# Patient Record
Sex: Female | Born: 1985 | Race: Asian | Hispanic: No | Smoking: Former smoker
Health system: Southern US, Community
[De-identification: ages and names within clinical notes are randomized; demographics above are authoritative.]

## PROBLEM LIST (undated history)

## (undated) DIAGNOSIS — E282 Polycystic ovarian syndrome: Secondary | ICD-10-CM

## (undated) DIAGNOSIS — I10 Essential (primary) hypertension: Secondary | ICD-10-CM

## (undated) DIAGNOSIS — T7840XA Allergy, unspecified, initial encounter: Secondary | ICD-10-CM

## (undated) DIAGNOSIS — I471 Supraventricular tachycardia, unspecified: Secondary | ICD-10-CM

## (undated) HISTORY — DX: Supraventricular tachycardia: I47.1

## (undated) HISTORY — DX: Supraventricular tachycardia, unspecified: I47.10

## (undated) HISTORY — DX: Allergy, unspecified, initial encounter: T78.40XA

## (undated) HISTORY — DX: Polycystic ovarian syndrome: E28.2

---

## 2009-11-04 HISTORY — PX: CARDIAC ELECTROPHYSIOLOGY MAPPING AND ABLATION: SHX1292

## 2014-03-18 LAB — HM PAP SMEAR: HM PAP: NORMAL

## 2015-08-24 ENCOUNTER — Ambulatory Visit: Payer: Self-pay

## 2015-08-24 DIAGNOSIS — I10 Essential (primary) hypertension: Secondary | ICD-10-CM | POA: Insufficient documentation

## 2015-08-24 DIAGNOSIS — F419 Anxiety disorder, unspecified: Secondary | ICD-10-CM | POA: Insufficient documentation

## 2015-08-24 DIAGNOSIS — Z9889 Other specified postprocedural states: Secondary | ICD-10-CM | POA: Insufficient documentation

## 2015-08-31 ENCOUNTER — Institutional Professional Consult (permissible substitution): Payer: Self-pay | Admitting: Licensed Clinical Social Worker

## 2016-02-23 DIAGNOSIS — Z9889 Other specified postprocedural states: Secondary | ICD-10-CM

## 2016-02-23 DIAGNOSIS — F419 Anxiety disorder, unspecified: Secondary | ICD-10-CM

## 2016-02-23 DIAGNOSIS — I10 Essential (primary) hypertension: Secondary | ICD-10-CM

## 2016-03-15 ENCOUNTER — Encounter: Payer: Self-pay | Admitting: Emergency Medicine

## 2016-03-15 ENCOUNTER — Emergency Department
Admission: EM | Admit: 2016-03-15 | Discharge: 2016-03-15 | Disposition: A | Discharge status: Home / Self Care | Payer: 59 | Attending: Emergency Medicine | Admitting: Emergency Medicine

## 2016-03-15 DIAGNOSIS — Y999 Unspecified external cause status: Secondary | ICD-10-CM | POA: Diagnosis not present

## 2016-03-15 DIAGNOSIS — T23231A Burn of second degree of multiple right fingers (nail), not including thumb, initial encounter: Secondary | ICD-10-CM | POA: Insufficient documentation

## 2016-03-15 DIAGNOSIS — X19XXXA Contact with other heat and hot substances, initial encounter: Secondary | ICD-10-CM | POA: Insufficient documentation

## 2016-03-15 DIAGNOSIS — T23221A Burn of second degree of single right finger (nail) except thumb, initial encounter: Secondary | ICD-10-CM

## 2016-03-15 DIAGNOSIS — Y939 Activity, unspecified: Secondary | ICD-10-CM | POA: Insufficient documentation

## 2016-03-15 DIAGNOSIS — Y929 Unspecified place or not applicable: Secondary | ICD-10-CM | POA: Diagnosis not present

## 2016-03-15 DIAGNOSIS — I10 Essential (primary) hypertension: Secondary | ICD-10-CM | POA: Insufficient documentation

## 2016-03-15 DIAGNOSIS — F17210 Nicotine dependence, cigarettes, uncomplicated: Secondary | ICD-10-CM | POA: Insufficient documentation

## 2016-03-15 NOTE — ED Provider Notes (Signed)
[facility] Emergency Department Provider Note  ____________________________________________  Time seen: 3:20 AM  I have reviewed the triage vital signs and the nursing notes.   HISTORY  Chief Complaint Burn    HPI Brittany Walter is a 30 y.o. female who reports spilling boiling water on her right second third and fourth fingers at about 9:30 PM tonight. She immediately rinsed it in cold water but because of the pain she then stopped. Denies any other injuries. Last tetanus shot was in within the last 5 years.     Past Medical History  Diagnosis Date  . Allergy     Seasonal  . Supraventricular tachycardia [facility])      Patient Active Problem List   Diagnosis Date Noted  . Hypertension 08/24/2015  . Acute anxiety 08/24/2015  . History of cardiac radiofrequency ablation 08/24/2015     Past Surgical History  Procedure Laterality Date  . Cardiac electrophysiology mapping and ablation  2011     No current outpatient prescriptions on file.   Allergies Review of patient's allergies indicates no known allergies.   Family History  Problem Relation Age of Onset  . Adopted: Yes  . Family history unknown: Yes    Social History Social History  Substance Use Topics  . Smoking status: Current Every Day Smoker    Types: Cigarettes  . Smokeless tobacco: None  . Alcohol Use: No    Review of Systems  Constitutional:   No fever or chills.  Eyes:   No vision changes. No eye pain ENT:   No sore throat. No rhinorrhea. Musculoskeletal:   Pain with movement of the right hand fingers 10-point ROS otherwise negative.  ____________________________________________   PHYSICAL EXAM:  VITAL SIGNS: ED Triage Vitals  Enc Vitals Group     BP 03/15/16 0259 148/95 mmHg     Pulse Rate 03/15/16 0259 91     Resp 03/15/16 0259 20     Temp 03/15/16 0259 98 F (36.7 C)     Temp Source 03/15/16 0259 Oral     SpO2 03/15/16 0259 99 %     Weight 03/15/16  0259 147 lb (66.679 kg)     Height 03/15/16 0259  (1.499 m)     Head Cir --      Peak Flow --      Pain Score 03/15/16 0259 8     Pain Loc --      Pain Edu? --      Excl. in GC? --     Vital signs reviewed, nursing assessments reviewed.   Constitutional:   Alert and oriented. Well appearing and in no distress. Eyes:   No scleral icterus. No conjunctival pallor. PERRL. EOMI.  No nystagmus. ENT   Head:   Normocephalic and atraumatic.No splash injuries Musculoskeletal:   Tenderness in the right hand on the second third and fourth fingers. Soft tissues are soft. Intact distal perfusion with brisk cap refill in the fingertips. Over the right fourth finger on the dorsal DIP surface, there is a small approximately 2 millimeter blistering. Intact flexion and extension. Neurologic:   Normal speech and language.  CN 2-10 normal. Motor grossly intact. No gross focal neurologic deficits are appreciated.  Skin:    Skin is warm, dry and intact. Small blister as noted above. Erythema of the palmar hand and fingers.. No rash noted.  No petechiae, purpura, or bullae.  ____________________________________________    LABS (pertinent positives/negatives) (all labs ordered are listed, but only abnormal  results are displayed) Labs Reviewed - No data to display ____________________________________________   EKG    ____________________________________________    RADIOLOGY    ____________________________________________   PROCEDURES   ____________________________________________   INITIAL IMPRESSION / ASSESSMENT AND PLAN / ED COURSE  Pertinent labs & imaging results that were available during my care of the patient were reviewed by me and considered in my medical decision making (see chart for details).  Patient well appearing no acute distress. Presents with partial-thickness burn to the right hand fingers due to boiling water. She rinsed immediately, there is no evidence  of deep injury at this time. No evidence of compartment syndrome of the fingers or impaired perfusion. His well-appearing no acute distress, counseled for NSAIDs and follow up with primary care.     ____________________________________________   FINAL CLINICAL IMPRESSION(S) / ED DIAGNOSES  Final diagnoses:  Second degree burn of fingers, right, initial encounter       Portions of this note were generated with dragon dictation software. Dictation errors may occur despite best attempts at proofreading.   Sharman CheekPhillip Jeevan Kalla, MD 03/15/16 [phone]

## 2016-03-15 NOTE — ED Notes (Signed)
Patient ambulatory to triage with steady gait, without difficulty or distress noted; pt reports burn to right finger with boiling water while cooking few hrs PTA

## 2016-03-15 NOTE — Discharge Instructions (Signed)
Burn Care °Your skin is a natural barrier to infection. It is the largest organ of your body. Burns damage this natural protection. To help prevent infection, it is very important to follow your caregiver's instructions in the care of your burn. °Burns are classified as: °· First degree. There is only redness of the skin (erythema). No scarring is expected. °· Second degree. There is blistering of the skin. Scarring may occur with deeper burns. °· Third degree. All layers of the skin are injured, and scarring is expected. °HOME CARE INSTRUCTIONS  °· Wash your hands well before changing your bandage. °· Change your bandage as often as directed by your caregiver. °· Remove the old bandage. If the bandage sticks, you may soak it off with cool, clean water. °· Cleanse the burn thoroughly but gently with mild soap and water. °· Pat the area dry with a clean, dry cloth. °· Apply a thin layer of antibacterial cream to the burn. °· Apply a clean bandage as instructed by your caregiver. °· Keep the bandage as clean and dry as possible. °· Elevate the affected area for the first 24 hours, then as instructed by your caregiver. °· Only take over-the-counter or prescription medicines for pain, discomfort, or fever as directed by your caregiver. °SEEK IMMEDIATE MEDICAL CARE IF:  °· You develop excessive pain. °· You develop redness, tenderness, swelling, or red streaks near the burn. °· The burned area develops yellowish-white fluid (pus) or a bad smell. °· You have a fever. °MAKE SURE YOU:  °· Understand these instructions. °· Will watch your condition. °· Will get help right away if you are not doing well or get worse. °  °This information is not intended to replace advice given to you by your health care provider. Make sure you discuss any questions you have with your health care provider. °  °Document Released: 10/21/2005 Document Revised: 01/13/2012 Document Reviewed: 03/13/2011 °Elsevier Interactive Patient Education ©2016  [employer] ° °Second-Degree Burn °A second-degree burn affects the 2 outer layers of skin. The outer layer (epidermis) and the layer underneath it (dermis) are both burned. Another name for this type of burn is a partial thickness burn. A second-degree burn may be called minor or major. This depends on the size of the burn. It also depends on what parts of the skin are burned. Minor burns may be treated with first aid. Major burns are a medical emergency. °A second-degree burn is worse than a first-degree burn, but not as bad as a third-degree burn. A first-degree burn affects only the epidermis. A third-degree burn goes through all the layers of skin. A second-degree burn usually heals in 3 to 4 weeks. A minor second-degree burn usually does not leave a scar. Deeper second-degree burns may lead to scarring of the skin or contractures over joints. Contractures are scars that form over joints and may lead to reduced mobility at those joints. °CAUSES °· Heat (thermal) injury. This happens when skin comes in contact with something very hot. It could be a flame, a hot object, hot liquid, or steam. Most second-degree burns are thermal injuries. °· Radiation. Sunlight is one type of radiation that can burn the skin. Another type of radiation is used to heat food. Radiation is also used to treat some diseases, such as cancer. All types of radiation can burn the skin. Sunlight usually causes a first-degree burn. Radiation used for heating food or treating a disease can cause a second-degree burn. °· Electricity. Electrical burns can cause more   damage under the skin than on the surface. They should always be treated as major burns. °· Chemicals. Many chemicals can burn the skin. The burn should be flushed with cool water and checked by an emergency caregiver. °SYMPTOMS °Symptoms of second-degree burns include: °· Severe pain. °· Extreme tenderness. °· Deep redness. °· Blistered skin. °· Skin that has changed color. It  might look blotchy, wet, or shiny. °· Swelling. °TREATMENT °Some second-degree burns may need to be treated in a hospital. These include major burns, electrical burns, and chemical burns. Many other second-degree burns can be treated with regular first aid, such as: °· Cooling the burn. Use cool, germ-free (sterile) salt water. Place the burned area of skin into a tub of water, or cover the burned area with clean, wet towels. °· Taking pain medicine. °· Removing the dead skin from broken blisters. A trained caregiver may do this. Do not pop blisters. °· Gently washing your skin with mild soap. °· Covering the burned area with a cream. Silver sulfadiazine is a cream for burns. An antibiotic cream, such as bacitracin, may also be used to fight infection. Do not use other ointments or creams unless your caregiver says it is okay. °· Protecting the burn with a sterile, non-sticky bandage. °· Bandaging fingers and toes separately. This keeps them from sticking together. °· Taking an antibiotic. This can help prevent infection. °· Getting a tetanus shot. °HOME CARE INSTRUCTIONS °Medication °· Take any medicine prescribed by your caregiver. Follow the directions carefully. °· Ask your caregiver if you can take over-the-counter medicine to relieve pain and swelling. Do not give aspirin to children. °· Make sure your caregiver knows about all other medicines you take. This includes over-the-counter medicines. °Burn care °· You will need to change the bandage on your burn. You may need to do this 2 or 3 times each day. °¨ Gently clean the burned area. °¨ Put ointment on it. °¨ Cover the burn with a sterile bandage. °· For some deeper burns or burns that cover a large area, compression garments may be prescribed. These garments can help minimize scarring and protect your mobility. °· Do not put butter or oil on your skin. Use only the cream prescribed by your caregiver. °· Do not put ice on your burn. °· Do not break blisters  on your skin. °· Keep the bandaged area dry. You might need to take a sponge bath for awhile. Ask your caregiver when you can take a shower or a tub bath again. °· Do not scratch an itchy burn. Your caregiver may give you medicine to relieve very bad itching. °· Infection is a big danger after a second-degree burn. Tell your caregiver right away if you have signs of infection, such as: °¨ Redness or changing color in the burned area. °¨ Fluid leaking from the burn. °¨ Swelling in the burn area. °¨ A bad smell coming from the wound. °Follow-up °· Keep all follow-up appointments. This is important. This is how your caregiver can tell if your treatment is working. °· Protect your burn from sunlight. Use sunscreen whenever you go outside. Burned areas may be sensitive to the sun for up to 1 year. Exposure to the sun may also cause permanent darkening of scars. °SEEK MEDICAL CARE IF: °· You have any questions about medicines. °· You have any questions about your treatment. °· You wonder if it is okay to do a particular activity. °· You develop a fever of more than 100.5° F (38.1° C). °SEEK IMMEDIATE MEDICAL CARE IF: °·   You think your burn might be infected. It may change color, become red, leak fluid, swell, or smell bad. °· You develop a fever of more than 102° F (38.9° C). °  °This information is not intended to replace advice given to you by your health care provider. Make sure you discuss any questions you have with your health care provider. °  °Document Released: 03/25/2011 Document Revised: 01/13/2012 Document Reviewed: 03/25/2011 °Elsevier Interactive Patient Education ©2016 Elsevier Inc. ° °

## 2016-03-15 NOTE — ED Notes (Signed)
MD at bedside. 

## 2018-05-19 ENCOUNTER — Ambulatory Visit (INDEPENDENT_AMBULATORY_CARE_PROVIDER_SITE_OTHER): Payer: 59 | Admitting: Physician Assistant

## 2018-05-19 ENCOUNTER — Encounter: Payer: Self-pay | Admitting: Physician Assistant

## 2018-05-19 VITALS — BP 130/88 | HR 107 | Temp 98.5°F | Resp 16 | Wt 167.0 lb

## 2018-05-19 DIAGNOSIS — Z114 Encounter for screening for human immunodeficiency virus [HIV]: Secondary | ICD-10-CM

## 2018-05-19 DIAGNOSIS — Z8742 Personal history of other diseases of the female genital tract: Secondary | ICD-10-CM

## 2018-05-19 DIAGNOSIS — R030 Elevated blood-pressure reading, without diagnosis of hypertension: Secondary | ICD-10-CM | POA: Diagnosis not present

## 2018-05-19 DIAGNOSIS — Z0001 Encounter for general adult medical examination with abnormal findings: Secondary | ICD-10-CM

## 2018-05-19 DIAGNOSIS — Z0000 Encounter for general adult medical examination without abnormal findings: Secondary | ICD-10-CM

## 2018-05-19 DIAGNOSIS — E282 Polycystic ovarian syndrome: Secondary | ICD-10-CM | POA: Diagnosis not present

## 2018-05-19 DIAGNOSIS — Z130 Encounter for screening for diseases of the blood and blood-forming organs and certain disorders involving the immune mechanism: Secondary | ICD-10-CM

## 2018-05-19 DIAGNOSIS — Z131 Encounter for screening for diabetes mellitus: Secondary | ICD-10-CM

## 2018-05-19 DIAGNOSIS — Z13220 Encounter for screening for lipoid disorders: Secondary | ICD-10-CM

## 2018-05-19 DIAGNOSIS — Z124 Encounter for screening for malignant neoplasm of cervix: Secondary | ICD-10-CM

## 2018-05-19 DIAGNOSIS — Z1329 Encounter for screening for other suspected endocrine disorder: Secondary | ICD-10-CM

## 2018-05-19 MED ORDER — METFORMIN HCL ER 500 MG PO TB24: ORAL_TABLET | ORAL | 0 refills | Status: DC

## 2018-05-19 NOTE — Progress Notes (Signed)
[name], Female    DOB: Feb 16, 1986, 32 y.o.   MRN: [id number] Visit Date: 05/19/2018  Today's Provider: Trey Sailors, PA-C   Chief Complaint  Patient presents with  . Establish Care  . Annual Exam   Subjective:    Establish Care: Brittany Walter is a 32 y.o. female who presents today to establish care and for a complete physical maintenance. Patient sleeps well. Patient is exercising a little. Living with [facility] of two and half years. No children. Works at [employer].   Patient reports her las physical was 2014-2015. Reports that her pap at that time was normal. She reports that her previous PCP is in [place]. Practice name [facility].  History PCOS - previously diagnosed by endocrinologist and was given Metformin 800 mg, had hard time swallowing pills. Would like to restart this. -----------------------------------------------------------------   Review of Systems  Constitutional: Negative.   HENT: Negative.   Eyes: Positive for itching.  Respiratory: Negative.   Cardiovascular: Negative.   Gastrointestinal: Negative.   Endocrine: Negative.   Genitourinary: Negative.   Musculoskeletal: Positive for neck stiffness.       "Possible Ganglion"  Skin: Negative.   Allergic/Immunologic: Negative.   Neurological: Negative.   Hematological: Negative.   Psychiatric/Behavioral: Negative.     Social History      She  reports that she has been smoking cigarettes.  She does not have any smokeless tobacco history on file. She reports that she does not drink alcohol or use drugs.       Social History   Socioeconomic History  . Marital status: Single    Spouse name: Not on file  . Number of children: Not on file  . Years of education: Not on file  . Highest education level: Not on file  Occupational History  . Not on file  Social Needs  . Financial resource strain: Not on file  . Food insecurity:    Worry: Not on  file    Inability: Not on file  . Transportation needs:    Medical: Not on file    Non-medical: Not on file  Tobacco Use  . Smoking status: Current Every Day Smoker    Types: Cigarettes  Substance and Sexual Activity  . Alcohol use: No  . Drug use: No  . Sexual activity: Not on file  Lifestyle  . Physical activity:    Days per week: Not on file    Minutes per session: Not on file  . Stress: Not on file  Relationships  . Social connections:    Talks on phone: Not on file    Gets together: Not on file    Attends religious service: Not on file    Active member of club or organization: Not on file    Attends meetings of clubs or organizations: Not on file    Relationship status: Not on file  Other Topics Concern  . Not on file  Social History Narrative  . Not on file    Past Medical History:  Diagnosis Date  . Allergy    Seasonal  . PCOS (polycystic ovarian syndrome)   . Supraventricular tachycardia [facility])      Patient Active Problem List   Diagnosis Date Noted  . Hypertension 08/24/2015  . Acute anxiety 08/24/2015  . History of cardiac radiofrequency ablation 08/24/2015    Past Surgical History:  Procedure Laterality Date  . CARDIAC ELECTROPHYSIOLOGY MAPPING AND ABLATION  2011  Family History        No family status information on file.        Her She was adopted. Family history is unknown by patient.      No Known Allergies  No current outpatient medications on file.   Patient Care Team: Maryella ShiversPollak, [place] M, PA-C as PCP - General (Physician Assistant)      Objective:   Vitals: BP 130/88 (BP Location: Left Arm, Patient Position: Sitting, Cuff Size: Normal)   Pulse (!) 107   Temp 98.5 F (36.9 C) (Oral)   Resp 16   Wt 167 lb (75.8 kg)   SpO2 99%   BMI 33.73 kg/m    Vitals:   05/19/18 1503  BP: 130/88  Pulse: (!) 107  Resp: 16  Temp: 98.5 F (36.9 C)  TempSrc: Oral  SpO2: 99%  Weight: 167 lb (75.8 kg)     Physical Exam    Constitutional: She is oriented to person, place, and time. She appears well-developed and well-nourished.  HENT:  Head: Normocephalic.  Right Ear: External ear normal.  Left Ear: External ear normal.  Nose: Nose normal.  Mouth/Throat: Oropharynx is clear and moist.  Eyes: Pupils are equal, round, and reactive to light. Conjunctivae and EOM are normal.  Neck: Normal range of motion. Neck supple.  Cardiovascular: Normal rate, regular rhythm, normal heart sounds and intact distal pulses.  Pulmonary/Chest: Effort normal and breath sounds normal.  Abdominal: Soft. Bowel sounds are normal.  Genitourinary: Vagina normal and uterus normal. Cervix exhibits no motion tenderness and no friability.  Musculoskeletal: Normal range of motion.  Neurological: She is alert and oriented to person, place, and time.  Skin: Skin is warm.  Psychiatric: She has a normal mood and affect. Her behavior is normal. Judgment and thought content normal.     Depression Screen PHQ 2/9 Scores 05/19/2018  PHQ - 2 Score 0      Assessment & Plan:     Routine Health Maintenance and Physical Exam  Exercise Activities and Dietary recommendations Goals    None       There is no immunization history on file for this patient.  Health Maintenance  Topic Date Due  . HIV Screening  06/09/2003  . TETANUS/TDAP  06/09/2007  . PAP SMEAR  06/08/2009  . INFLUENZA VACCINE  06/04/2018     Discussed health benefits of physical activity, and encouraged her to engage in regular exercise appropriate for her age and condition.    1. Annual physical exam   2. PCOS (polycystic ovarian syndrome)  - metFORMIN (GLUCOPHAGE XR) 500 MG 24 hr tablet; 1 tab in the morning x 1wk. 1 tab in the morning & night x 1wk. 2 tabs in morning and 1 at night x 1 wk. 2 tabs in the morning and 2 night  Dispense: 180 tablet; Refill: 0  3. History of irregular menstrual cycles   4. Elevated BP without diagnosis of hypertension   5.  Cervical cancer screening  - Pap IG w/ reflex to HPV when ASC-U  6. Screening for thyroid disorder  - TSH  7. Lipid screening  - Lipid panel  8. Diabetes mellitus screening  - Comprehensive metabolic panel  9. Screening for deficiency anemia  - CBC with Differential/Platelet  10. Encounter for screening for HIV  - HIV antibody (with reflex)  Return in about 2 months (around 07/20/2018) for PCOS .  The entirety of the information documented in the History of Present Illness, Review of  Systems and Physical Exam were personally obtained by me. Portions of this information were initially documented by Hetty Ely, CMA and reviewed by me for thoroughness and accuracy.      --------------------------------------------------------------------    Trey Sailors, PA-C  [facility]

## 2018-05-20 LAB — COMPREHENSIVE METABOLIC PANEL
ALT: 39 IU/L — ABNORMAL HIGH (ref 0–32)
AST: 29 IU/L (ref 0–40)
Albumin/Globulin Ratio: 2 (ref 1.2–2.2)
Albumin: 5.3 g/dL (ref 3.5–5.5)
Alkaline Phosphatase: 53 IU/L (ref 39–117)
BUN/Creatinine Ratio: 21 (ref 9–23)
BUN: 13 mg/dL (ref 6–20)
Bilirubin Total: 0.3 mg/dL (ref 0.0–1.2)
CO2: 22 mmol/L (ref 20–29)
Calcium: 9.8 mg/dL (ref 8.7–10.2)
Chloride: 99 mmol/L (ref 96–106)
Creatinine, Ser: 0.63 mg/dL (ref 0.57–1.00)
GFR calc Af Amer: 140 mL/min/{1.73_m2} (ref >59)
GFR calc non Af Amer: 122 mL/min/{1.73_m2} (ref >59)
Globulin, Total: 2.7 g/dL (ref 1.5–4.5)
Glucose: 82 mg/dL (ref 65–99)
Potassium: 4 mmol/L (ref 3.5–5.2)
Sodium: 137 mmol/L (ref 134–144)
Total Protein: 8 g/dL (ref 6.0–8.5)

## 2018-05-20 LAB — CBC WITH DIFFERENTIAL/PLATELET
Basophils Absolute: 0.1 10*3/uL (ref 0.0–0.2)
Basos: 1 %
EOS (ABSOLUTE): 0.7 10*3/uL — ABNORMAL HIGH (ref 0.0–0.4)
Eos: 6 %
Hematocrit: 42.9 % (ref 34.0–46.6)
Hemoglobin: 15.5 g/dL (ref 11.1–15.9)
Immature Grans (Abs): 0 10*3/uL (ref 0.0–0.1)
Immature Granulocytes: 0 %
Lymphocytes Absolute: 3.9 10*3/uL — ABNORMAL HIGH (ref 0.7–3.1)
Lymphs: 35 %
MCH: 30.6 pg (ref 26.6–33.0)
MCHC: 36.1 g/dL — ABNORMAL HIGH (ref 31.5–35.7)
MCV: 85 fL (ref 79–97)
Monocytes Absolute: 0.7 10*3/uL (ref 0.1–0.9)
Monocytes: 6 %
Neutrophils Absolute: 5.8 10*3/uL (ref 1.4–7.0)
Neutrophils: 52 %
Platelets: 346 10*3/uL (ref 150–450)
RBC: 5.07 x10E6/uL (ref 3.77–5.28)
RDW: 13.7 % (ref 12.3–15.4)
WBC: 11.1 10*3/uL — ABNORMAL HIGH (ref 3.4–10.8)

## 2018-05-20 LAB — LIPID PANEL
Chol/HDL Ratio: 4.8 ratio — ABNORMAL HIGH (ref 0.0–4.4)
Cholesterol, Total: 200 mg/dL — ABNORMAL HIGH (ref 100–199)
HDL: 42 mg/dL (ref >39)
LDL Calculated: 118 mg/dL — ABNORMAL HIGH (ref 0–99)
Triglycerides: 200 mg/dL — ABNORMAL HIGH (ref 0–149)
VLDL Cholesterol Cal: 40 mg/dL (ref 5–40)

## 2018-05-20 LAB — TSH: TSH: 1.6 u[IU]/mL (ref 0.450–4.500)

## 2018-05-20 LAB — HIV ANTIBODY (ROUTINE TESTING W REFLEX): HIV Screen 4th Generation wRfx: NONREACTIVE

## 2018-05-21 LAB — PAP IG W/ RFLX HPV ASCU: PAP Smear Comment: 0

## 2018-05-22 ENCOUNTER — Telehealth: Payer: Self-pay

## 2018-05-22 NOTE — Telephone Encounter (Signed)
-----   Message from Trey SailorsAdriana M Pollak, [place]PA-C sent at 05/21/2018  4:44 PM EDT ----- Small white count, likely a virus not needing treatment. Normal CMET, no DM, normal kidney, one slightly elevated liver enzyme but just slightly. Cholesterol good. TSH normal, HIV negative, PAP is normal and should be repeated 3 years.

## 2018-05-22 NOTE — Telephone Encounter (Signed)
Pt advised.   Thanks,   -Tong Pieczynski  

## 2018-05-22 NOTE — Telephone Encounter (Signed)
LMTCB 05/22/2018  Thanks,   -Ayianna Darnold  

## 2018-06-03 ENCOUNTER — Encounter: Payer: Self-pay | Admitting: Physician Assistant

## 2018-08-11 ENCOUNTER — Ambulatory Visit: Payer: Self-pay | Admitting: Physician Assistant

## 2019-04-21 ENCOUNTER — Other Ambulatory Visit: Payer: Self-pay

## 2019-04-21 ENCOUNTER — Ambulatory Visit: Payer: 59 | Admitting: Physician Assistant

## 2019-04-21 ENCOUNTER — Encounter: Payer: Self-pay | Admitting: Physician Assistant

## 2019-04-21 VITALS — BP 150/90 | HR 99 | Temp 99.4°F | Resp 16 | Wt 171.2 lb

## 2019-04-21 DIAGNOSIS — I160 Hypertensive urgency: Secondary | ICD-10-CM | POA: Diagnosis not present

## 2019-04-21 MED ORDER — CLONIDINE HCL 0.1 MG PO TABS: 0.1000 mg | ORAL_TABLET | Freq: Every day | ORAL | 0 refills | Status: DC | PRN

## 2019-04-21 MED ORDER — CLONIDINE HCL 0.1 MG PO TABS
0.1000 mg | ORAL_TABLET | Freq: Every day | ORAL | 0 refills | Status: DC | PRN
Start: 2019-04-21 — End: 2019-06-17

## 2019-04-21 NOTE — Patient Instructions (Signed)
Preventing Hypertension Hypertension, commonly called high blood pressure, is when the force of blood pumping through the arteries is too strong. Arteries are blood vessels that carry blood from the heart throughout the body. Over time, hypertension can damage the arteries and decrease blood flow to important parts of the body, including the brain, heart, and kidneys. Often, hypertension does not cause symptoms until blood pressure is very high. For this reason, it is important to have your blood pressure checked on a regular basis. Hypertension can often be prevented with diet and lifestyle changes. If you already have hypertension, you can control it with diet and lifestyle changes, as well as medicine. What nutrition changes can be made? Maintain a healthy diet. This includes:  Eating less salt (sodium). Ask your health care provider how much sodium is safe for you to have. The general recommendation is to consume less than 1 tsp (2,300 mg) of sodium a day. ? Do not add salt to your food. ? Choose low-sodium options when grocery shopping and eating out.  Limiting fats in your diet. You can do this by eating low-fat or fat-free dairy products and by eating less red meat.  Eating more fruits, vegetables, and whole grains. Make a goal to eat: ? 1-2 cups of fresh fruits and vegetables each day. ? 3-4 servings of whole grains each day.  Avoiding foods and beverages that have added sugars.  Eating fish that contain healthy fats (omega-3 fatty acids), such as mackerel or salmon. If you need help putting together a healthy eating plan, try the DASH diet. This diet is high in fruits, vegetables, and whole grains. It is low in sodium, red meat, and added sugars. DASH stands for Dietary Approaches to Stop Hypertension. What lifestyle changes can be made?   Lose weight if you are overweight. Losing just 3?5% of your body weight can help prevent or control hypertension. ? For example, if your present  weight is 200 lb (91 kg), a loss of 3-5% of your weight means losing 6-10 lb (2.7-4.5 kg). ? Ask your health care provider to help you with a diet and exercise plan to safely lose weight.  Get enough exercise. Do at least 150 minutes of moderate-intensity exercise each week. ? You could do this in short exercise sessions several times a day, or you could do longer exercise sessions a few times a week. For example, you could take a brisk 10-minute walk or bike ride, 3 times a day, for 5 days a week.  Find ways to reduce stress, such as exercising, meditating, listening to music, or taking a yoga class. If you need help reducing stress, ask your health care provider.  Do not smoke. This includes e-cigarettes. Chemicals in tobacco and nicotine products raise your blood pressure each time you smoke. If you need help quitting, ask your health care provider.  Avoid alcohol. If you drink alcohol, limit alcohol intake to no more than 1 drink a day for nonpregnant women and 2 drinks a day for men. One drink equals 12 oz of beer, 5 oz of wine, or 1 oz of hard liquor. Why are these changes important? Diet and lifestyle changes can help you prevent hypertension, and they may make you feel better overall and improve your quality of life. If you have hypertension, making these changes will help you control it and help prevent major complications, such as:  Hardening and narrowing of arteries that supply blood to: ? Your heart. This can cause a heart  attack. ? Your brain. This can cause a stroke. ? Your kidneys. This can cause kidney failure.  Stress on your heart muscle, which can cause heart failure. What can I do to lower my risk?  Work with your health care provider to make a hypertension prevention plan that works for you. Follow your plan and keep all follow-up visits as told by your health care provider.  Learn how to check your blood pressure at home. Make sure that you know your personal target  blood pressure, as told by your health care provider. How is this treated? In addition to diet and lifestyle changes, your health care provider may recommend medicines to help lower your blood pressure. You may need to try a few different medicines to find what works best for you. You also may need to take more than one medicine. Take over-the-counter and prescription medicines only as told by your health care provider. Where to find support Your health care provider can help you prevent hypertension and help you keep your blood pressure at a healthy level. Your local hospital or your community may also provide support services and prevention programs. The American Heart Association offers an online support network at: https://www.lee.net/http://supportnetwork.heart.org/high-blood-pressure Where to find more information Learn more about hypertension from:  National Heart, Lung, and Blood Institute: https://www.peterson.org/www.nhlbi.nih.gov/health/health-topics/topics/hbp  Centers for Disease Control and Prevention: AboutHD.co.nzwww.cdc.gov/bloodpressure  American Academy of Family Physicians: http://familydoctor.org/familydoctor/en/diseases-conditions/high-blood-pressure.printerview.all.html Learn more about the DASH diet from:  National Heart, Lung, and Blood Institute: WedMap.itwww.nhlbi.nih.gov/health/health-topics/topics/dash Contact a health care provider if:  You think you are having a reaction to medicines you have taken.  You have recurrent headaches or feel dizzy.  You have swelling in your ankles.  You have trouble with your vision. Summary  Hypertension often does not cause any symptoms until blood pressure is very high. It is important to get your blood pressure checked regularly.  Diet and lifestyle changes are the most important steps in preventing hypertension.  By keeping your blood pressure in a healthy range, you can prevent complications like heart attack, heart failure, stroke, and kidney failure.  Work with your health care  provider to make a hypertension prevention plan that works for you. This information is not intended to replace advice given to you by your health care provider. Make sure you discuss any questions you have with your health care provider. Document Released: 11/05/2015 Document Revised: 07/01/2016 Document Reviewed: 07/01/2016 Elsevier Interactive Patient Education  2019 Elsevier Inc.   Clonidine tablets What is this medicine? CLONIDINE (KLOE ni deen) is used to treat high blood pressure. This medicine may be used for other purposes; ask your health care provider or pharmacist if you have questions. COMMON BRAND NAME(S): Catapres What should I tell my health care provider before I take this medicine? They need to know if you have any of these conditions: -kidney disease -an unusual or allergic reaction to clonidine, other medicines, foods, dyes, or preservatives -pregnant or trying to get pregnant -breast-feeding How should I use this medicine? Take this medicine by mouth with a glass of water. Follow the directions on the prescription label. Take your doses at regular intervals. Do not take your medicine more often than directed. Do not suddenly stop taking this medicine. You must gradually reduce the dose or you may get a dangerous increase in blood pressure. Ask your doctor or health care professional for advice. Talk to your pediatrician regarding the use of this medicine in children. Special care may be needed. Overdosage: If you  think you have taken too much of this medicine contact a poison control center or emergency room at once. NOTE: This medicine is only for you. Do not share this medicine with others. What if I miss a dose? If you miss a dose, take it as soon as you can. If it is almost time for your next dose, take only that dose. Do not take double or extra doses. What may interact with this medicine? Do not take this medicine with any of the following medications: -MAOIs  like Carbex, Eldepryl, Marplan, Nardil, and Parnate This medicine may also interact with the following medications: -barbiturate medicines for inducing sleep or treating seizures like phenobarbital -certain medicines for blood pressure, heart disease, irregular heart beat -certain medicines for depression, anxiety, or psychotic disturbances -prescription pain medicines This list may not describe all possible interactions. Give your health care provider a list of all the medicines, herbs, non-prescription drugs, or dietary supplements you use. Also tell them if you smoke, drink alcohol, or use illegal drugs. Some items may interact with your medicine. What should I watch for while using this medicine? Visit your doctor or health care professional for regular checks on your progress. Check your heart rate and blood pressure regularly while you are taking this medicine. Ask your doctor or health care professional what your heart rate should be and when you should contact him or her. You may get drowsy or dizzy. Do not drive, use machinery, or do anything that needs mental alertness until you know how this medicine affects you. To avoid dizzy or fainting spells, do not stand or sit up quickly, especially if you are an older person. Alcohol can make you more drowsy and dizzy. Avoid alcoholic drinks. Your mouth may get dry. Chewing sugarless gum or sucking hard candy, and drinking plenty of water will help. Do not treat yourself for coughs, colds, or pain while you are taking this medicine without asking your doctor or health care professional for advice. Some ingredients may increase your blood pressure. If you are going to have surgery tell your doctor or health care professional that you are taking this medicine. What side effects may I notice from receiving this medicine? Side effects that you should report to your doctor or health care professional as soon as possible: -allergic reactions like skin  rash, itching or hives, swelling of the face, lips, or tongue -anxiety, nervousness -chest pain -depression -fast, irregular heartbeat -swelling of feet or legs -unusually weak or tired Side effects that usually do not require medical attention (report to your doctor or health care professional if they continue or are bothersome): -change in sex drive or performance -constipation -headache This list may not describe all possible side effects. Call your doctor for medical advice about side effects. You may report side effects to FDA at 1-800-FDA-1088. Where should I keep my medicine? Keep out of the reach of children. Store at room temperature between 15 and 30 degrees C (59 and 86 degrees F). Protect from light. Keep container tightly closed. Throw away any unused medicine after the expiration date. NOTE: This sheet is a summary. It may not cover all possible information. If you have questions about this medicine, talk to your doctor, pharmacist, or health care provider.  2019 Elsevier/Gold Standard (2011-04-17 13:01:28)

## 2019-04-21 NOTE — Progress Notes (Signed)
[name] Female    DOB: 10-17-1986   33 y.o.   MRN: [id number] Visit Date: 04/21/2019  Today's Provider: Mar Daring, PA-C   Chief Complaint  Patient presents with  . Hypertension   Subjective:    I,Joseline E. Rosas,RMA am acting as a Education administrator for [employer], PA-C.  HPI Patient here with c/o elevated blood pressure.She reports that it was 165/107 today at 1:30 pm at work. She works at [employer]. She reports that she went into work today and had some headache and took some headache relief medicine. She reports that by lunch her face started to feel tingling and she reports that when she got back to work she started to work and she just felt like she was going to faint. She reports that once she was more calmed they rechecked her blood pressure and it was 152/105 before coming in. Denies chest pain,visual disturbances, sob, edema. Reports that her temperature this morning was 97.8.  BP Readings from Last 3 Encounters:  04/21/19 (!) 160/106  05/19/18 130/88  03/15/16 (!) 129/98    No Known Allergies   Current Outpatient Medications:  .  metFORMIN (GLUCOPHAGE XR) 500 MG 24 hr tablet, 1 tab in the morning x 1wk. 1 tab in the morning & night x 1wk. 2 tabs in morning and 1 at night x 1 wk. 2 tabs in the morning and 2 night, Disp: 180 tablet, Rfl: 0  Review of Systems  Constitutional: Negative for chills, fatigue and fever.  Eyes: Negative for visual disturbance.  Respiratory: Negative for chest tightness, shortness of breath and wheezing.   Cardiovascular: Negative for chest pain, palpitations and leg swelling.  Gastrointestinal: Negative for abdominal pain and nausea.  Neurological: Positive for headaches.    Social History   Tobacco Use  . Smoking status: Current Every Day Smoker    Types: Cigarettes  Substance Use Topics  . Alcohol use: No      Objective:   BP (!) 160/106 (BP Location: Left Arm, Patient Position: Sitting, Cuff Size:  Large)   Pulse 99   Temp 99.4 F (37.4 C) (Oral)   Resp 16   Wt 171 lb 3.2 oz (77.7 kg)   BMI 34.58 kg/m  Vitals:   04/21/19 1549  BP: (!) 160/106  Pulse: 99  Resp: 16  Temp: 99.4 F (37.4 C)  TempSrc: Oral  Weight: 171 lb 3.2 oz (77.7 kg)     Physical Exam Vitals signs reviewed.  Constitutional:      General: She is not in acute distress.    Appearance: Normal appearance. She is well-developed. She is obese. She is not diaphoretic.  HENT:     Head: Normocephalic and atraumatic.     Nose: Nose normal.     Mouth/Throat:     Mouth: Mucous membranes are moist.  Eyes:     General: No scleral icterus.    Extraocular Movements: Extraocular movements intact.     Pupils: Pupils are equal, round, and reactive to light.  Neck:     Musculoskeletal: Normal range of motion and neck supple.     Thyroid: No thyromegaly.     Vascular: No JVD.     Trachea: No tracheal deviation.  Cardiovascular:     Rate and Rhythm: Normal rate and regular rhythm.     Pulses: Normal pulses.     Heart sounds: Normal heart sounds. No murmur. No friction rub. No gallop.   Pulmonary:  Effort: Pulmonary effort is normal. No respiratory distress.     Breath sounds: Normal breath sounds. No wheezing or rales.  Lymphadenopathy:     Cervical: No cervical adenopathy.  Skin:    Capillary Refill: Capillary refill takes less than 2 seconds.  Neurological:     General: No focal deficit present.     Mental Status: She is alert and oriented to person, place, and time. Mental status is at baseline.     Cranial Nerves: No cranial nerve deficit.     Motor: No weakness.     Gait: Gait normal.        Assessment & Plan    1. Hypertensive urgency Discussed multiple options with patient and she wishes to only take a prn medication. Will prescribe clonidine as below. Advised patient that if she is taking the clonidine daily, she needs to call the office or send a mychart message so we can put her on a BP  medication more appropriate for initiation like amlodipine 5mg . She is also to check her BP daily. Call if symptoms worsen or are not improving. Has f/u on 05/25/19 for CPE and can have BP re-evaluated then. - cloNIDine (CATAPRES) 0.1 MG tablet; Take 1 tablet (0.1 mg total) by mouth daily as needed.  Dispense: 30 tablet; Refill: 0    Margaretann LovelessJennifer M Jabre Heo, PA-C  [facility]

## 2019-04-22 ENCOUNTER — Encounter: Payer: Self-pay | Admitting: Physician Assistant

## 2019-04-22 DIAGNOSIS — Z9889 Other specified postprocedural states: Secondary | ICD-10-CM

## 2019-04-26 ENCOUNTER — Encounter: Payer: Self-pay | Admitting: Physician Assistant

## 2019-05-24 ENCOUNTER — Telehealth: Payer: Self-pay

## 2019-05-24 NOTE — Telephone Encounter (Signed)
Left message for patient to call back and reschedule.

## 2019-05-25 ENCOUNTER — Encounter: Payer: Self-pay | Admitting: Physician Assistant

## 2019-05-25 DIAGNOSIS — R002 Palpitations: Secondary | ICD-10-CM | POA: Insufficient documentation

## 2019-05-25 DIAGNOSIS — I10 Essential (primary) hypertension: Secondary | ICD-10-CM | POA: Insufficient documentation

## 2019-05-25 DIAGNOSIS — E782 Mixed hyperlipidemia: Secondary | ICD-10-CM | POA: Insufficient documentation

## 2019-05-25 DIAGNOSIS — I471 Supraventricular tachycardia: Secondary | ICD-10-CM | POA: Insufficient documentation

## 2019-06-17 ENCOUNTER — Other Ambulatory Visit: Payer: Self-pay

## 2019-06-17 ENCOUNTER — Encounter: Payer: Self-pay | Admitting: Physician Assistant

## 2019-06-17 ENCOUNTER — Ambulatory Visit (INDEPENDENT_AMBULATORY_CARE_PROVIDER_SITE_OTHER): Payer: 59 | Admitting: Physician Assistant

## 2019-06-17 VITALS — BP 112/90 | HR 88 | Temp 98.5°F | Resp 16 | Ht 58.5 in | Wt 152.2 lb

## 2019-06-17 DIAGNOSIS — E282 Polycystic ovarian syndrome: Secondary | ICD-10-CM | POA: Diagnosis not present

## 2019-06-17 DIAGNOSIS — I471 Supraventricular tachycardia, unspecified: Secondary | ICD-10-CM

## 2019-06-17 DIAGNOSIS — Z23 Encounter for immunization: Secondary | ICD-10-CM | POA: Diagnosis not present

## 2019-06-17 DIAGNOSIS — R002 Palpitations: Secondary | ICD-10-CM | POA: Diagnosis not present

## 2019-06-17 DIAGNOSIS — Z0000 Encounter for general adult medical examination without abnormal findings: Secondary | ICD-10-CM

## 2019-06-17 MED ORDER — METFORMIN HCL ER 500 MG PO TB24: 500.0000 mg | ORAL_TABLET | Freq: Every day | ORAL | 1 refills | Status: DC

## 2019-06-17 NOTE — Patient Instructions (Signed)
Diet for Polycystic Ovary Syndrome Polycystic ovary syndrome (PCOS) is a disorder of the chemicals (hormones) that regulate a woman's reproductive system, including monthly periods (menstruation). The condition causes important hormones to be out of balance. PCOS can:  Stop your periods or make them irregular.  Cause cysts to develop on your ovaries.  Make it difficult to get pregnant.  Stop your body from responding to the effects of insulin (insulin resistance). Insulin resistance can lead to obesity and diabetes. Changing what you eat can help you manage PCOS and improve your health. Following a balanced diet can help you lose weight and improve the way that your body uses insulin. What are tips for following this plan?  Follow a balanced diet for meals and snacks. Eat breakfast, lunch, dinner, and one or two snacks every day.  Include protein in each meal and snack.  Choose whole grains instead of products that are made with refined flour.  Eat a variety of foods.  Exercise regularly as told by your health care provider. Aim to do 30 or more minutes of exercise on most days of the week.  If you are overweight or obese: ? Pay attention to how many calories you eat. Cutting down on calories can help you lose weight. ? Work with your health care provider or a diet and nutrition specialist (dietitian) to figure out how many calories you need each day. What foods can I eat?  Fruits Include a variety of colors and types. All fruits are helpful for PCOS. Vegetables Include a variety of colors and types. All vegetables are helpful for PCOS. Grains Whole grains, such as whole wheat. Whole-grain breads, crackers, cereals, and pasta. Unsweetened oatmeal, bulgur, barley, quinoa, and brown rice. Tortillas made from corn or whole-wheat flour. Meats and other proteins Low-fat (lean) proteins, such as fish, chicken, beans, eggs, and tofu. Dairy Low-fat dairy products, such as skim milk,  cheese sticks, and yogurt. Beverages Low-fat or fat-free drinks, such as water, low-fat milk, sugar-free drinks, and small amounts of 100% fruit juice. Seasonings and condiments Ketchup. Mustard. Barbecue sauce. Relish. Low-fat or fat-free mayonnaise. Fats and oils Olive oil or canola oil. Walnuts and almonds. The items listed above may not be a complete list of recommended foods and beverages. Contact a dietitian for more options. What foods are not recommended? Foods that are high in calories or fat. Fried foods. Sweets. Products that are made from refined white flour, including white bread, pastries, white rice, and pasta. The items listed above may not be a complete list of foods and beverages to avoid. Contact a dietitian for more information. Summary  PCOS is a hormonal imbalance that affects a woman's reproductive system.  You can help to manage your PCOS by exercising regularly and eating a healthy, varied diet of vegetables, fruit, whole grains, low-fat (lean) protein, and low-fat dairy products.  Changing what you eat can improve the way that your body uses insulin, help your hormones reach normal levels, and help you lose weight. This information is not intended to replace advice given to you by your health care provider. Make sure you discuss any questions you have with your health care provider. Document Released: 02/12/2016 Document Revised: 02/10/2019 Document Reviewed: 08/25/2017 Elsevier Patient Education  2020 Elsevier Inc.  

## 2019-06-17 NOTE — Progress Notes (Signed)
Patient: Brittany GougeKatherine Mcinerney, Female    DOB: 12/30/1985, 33 y.o.   MRN: [id number] Visit Date: 06/17/2019  Today's Provider: Trey SailorsAdriana M Zaim Nitta, PA-C   Chief Complaint  Patient presents with  . Annual Exam   Subjective:    Annual physical exam Brittany GougeKatherine Zubiate is a 33 y.o. female who presents today for health maintenance and complete physical. She feels well. She reports exercising by walking 5 miles daily and states she is participating in [employer] . She reports she is sleeping well.  Last Reported Pap- 05/19/18, Normal  Tobacco abuse: 1/2 pack a day, trying to quit smoking.   PCOS: Has lost almost 20 lbs in two months. She is currently walking 5-6 miles per day and is making healthier eating choices. She has gotten a menstrual cycle, the first one in years  Tetanus shot: due today.   Wt Readings from Last 3 Encounters:  06/17/19 152 lb 3.2 oz (69 kg)  04/21/19 171 lb 3.2 oz (77.7 kg)  05/19/18 167 lb (75.8 kg)   Eleavetd blood pressure: Was seen previously in this clinic for elevated BP. She was prescribed clonidine for blood pressure spikes. She has used this 3-4 times. She checks her BP at home which is mostly normal but occasionally can spike. Recently she has seen cardiology and has undergone Holter monitoring. She has a stress test scheduled today and will be reviewing the results from the Holter monitor as well.  -----------------------------------------------------------------   Review of Systems  All other systems reviewed and are negative.   Social History She  reports that she has been smoking cigarettes. She does not have any smokeless tobacco history on file. She reports that she does not drink alcohol or use drugs. Social History   Socioeconomic History  . Marital status: Single    Spouse name: Not on file  . Number of children: Not on file  . Years of education: Not on file  . Highest education level: Not on file  Occupational History  . Not on file   Social Needs  . Financial resource strain: Not on file  . Food insecurity    Worry: Not on file    Inability: Not on file  . Transportation needs    Medical: Not on file    Non-medical: Not on file  Tobacco Use  . Smoking status: Current Every Day Smoker    Types: Cigarettes  Substance and Sexual Activity  . Alcohol use: No  . Drug use: No  . Sexual activity: Not on file  Lifestyle  . Physical activity    Days per week: Not on file    Minutes per session: Not on file  . Stress: Not on file  Relationships  . Social Musicianconnections    Talks on phone: Not on file    Gets together: Not on file    Attends religious service: Not on file    Active member of club or organization: Not on file    Attends meetings of clubs or organizations: Not on file    Relationship status: Not on file  Other Topics Concern  . Not on file  Social History Narrative  . Not on file    Patient Active Problem List   Diagnosis Date Noted  . PSVT (paroxysmal supraventricular tachycardia) (HCC) 05/25/2019  . Palpitations 05/25/2019  . Hyperlipidemia, mixed 05/25/2019  . Benign essential HTN 05/25/2019  . Hypertension 08/24/2015  . Acute anxiety 08/24/2015  . History of cardiac radiofrequency  ablation 08/24/2015    Past Surgical History:  Procedure Laterality Date  . CARDIAC ELECTROPHYSIOLOGY MAPPING AND ABLATION  2011    Family History  No family status information on file.   Her She was adopted. Family history is unknown by patient.     No Known Allergies  Previous Medications   CLONIDINE (CATAPRES) 0.1 MG TABLET    Take 1 tablet (0.1 mg total) by mouth daily as needed.   METFORMIN (GLUCOPHAGE XR) 500 MG 24 HR TABLET    1 tab in the morning x 1wk. 1 tab in the morning & night x 1wk. 2 tabs in morning and 1 at night x 1 wk. 2 tabs in the morning and 2 night    Patient Care Team: Maryella ShiversPollak, Tayanna Talford M, PA-C as PCP - General (Physician Assistant)      Objective:   Vitals: BP 112/90   Pulse  88   Temp 98.5 F (36.9 C) (Oral)   Resp 16   Ht 4' 10.5" (1.486 m)   Wt 152 lb 3.2 oz (69 kg)   LMP 06/07/2019   SpO2 99%   BMI 31.27 kg/m    Physical Exam Constitutional:      Appearance: Normal appearance.  Cardiovascular:     Rate and Rhythm: Normal rate and regular rhythm.     Heart sounds: Normal heart sounds.  Pulmonary:     Effort: Pulmonary effort is normal.     Breath sounds: Normal breath sounds.  Chest:     Breasts:        Right: Normal.        Left: Normal.  Abdominal:     General: Abdomen is flat. Bowel sounds are normal.     Palpations: Abdomen is soft.  Skin:    General: Skin is warm and dry.  Neurological:     Mental Status: She is alert and oriented to person, place, and time. Mental status is at baseline.  Psychiatric:        Mood and Affect: Mood normal.        Behavior: Behavior normal.      Depression Screen PHQ 2/9 Scores 06/17/2019 05/19/2018  PHQ - 2 Score 0 0  PHQ- 9 Score 0 -      Assessment & Plan:     Routine Health Maintenance and Physical Exam  Exercise Activities and Dietary recommendations Goals   None      There is no immunization history on file for this patient.  Health Maintenance  Topic Date Due  . TETANUS/TDAP  06/09/2007  . INFLUENZA VACCINE  06/05/2019  . PAP SMEAR-Modifier  05/19/2021  . HIV Screening  Completed     Discussed health benefits of physical activity, and encouraged her to engage in regular exercise appropriate for her age and condition.    1. Annual physical exam  Updated today.   - Tdap vaccine greater than or equal to 7yo IM  2. PCOS (polycystic ovarian syndrome)  She has lost twenty pounds. Congratulated her own this. She also reports return of menses, which is a good indication that she is becoming more insulin sensitized. She would like to try taking metformin again which she had formerly discontinued due to diarrhea. Will try again at low dose, recommend increasing by one pill  every month. If she has diarrhea on the lowest dose, advised she will likely have the same at higher doses.   - metFORMIN (GLUCOPHAGE-XR) 500 MG 24 hr tablet; Take 1 tablet (500 mg  total) by mouth daily with breakfast.  Dispense: 90 tablet; Refill: 1  3. Palpitations  Followed by Dr. Nehemiah Massed in cardiology.   4. PSVT  Followed by cardiology.  The entirety of the information documented in the History of Present Illness, Review of Systems and Physical Exam were personally obtained by me. Portions of this information were initially documented by Jennings Books, CMA and reviewed by me for thoroughness and accuracy.    -------------------------------------------------------------------- Fritzi Mandes Wolford,acting as a scribe for Trinna Post, PA-C.,have documented all relevant documentation on the behalf of Trinna Post, PA-C,as directed by  Trinna Post, PA-C while in the presence of Trinna Post, PA-C.

## 2019-11-05 DIAGNOSIS — C801 Malignant (primary) neoplasm, unspecified: Secondary | ICD-10-CM

## 2019-11-05 HISTORY — PX: TOTAL THYROIDECTOMY: SHX2547

## 2019-11-05 HISTORY — DX: Malignant (primary) neoplasm, unspecified: C80.1

## 2019-12-12 ENCOUNTER — Other Ambulatory Visit: Payer: Self-pay | Admitting: Physician Assistant

## 2019-12-12 DIAGNOSIS — E282 Polycystic ovarian syndrome: Secondary | ICD-10-CM

## 2019-12-12 NOTE — Telephone Encounter (Signed)
Requested medication (s) are due for refill today: yes  Requested medication (s) are on the active medication list: prescription exp 12/14/19  Last refill:  06/17/19  Future visit scheduled: no  Notes to clinic:  prescription expired.   Requested Prescriptions  Pending Prescriptions Disp Refills   metFORMIN (GLUCOPHAGE-XR) 500 MG 24 hr tablet [Pharmacy Med Name: METFORMIN ER 500MG 24HR TABS] 90 tablet 1    Sig: TAKE 1 TABLET(500 MG) BY MOUTH DAILY WITH BREAKFAST      Endocrinology:  Diabetes - Biguanides Failed - 12/12/2019  8:28 AM      Failed - Cr in normal range and within 360 days    Creatinine, Ser  Date Value Ref Range Status  05/19/2018 0.63 0.57 - 1.00 mg/dL Final          Failed - HBA1C is between 0 and 7.9 and within 180 days    No results found for: HGBA1C, LABA1C        Failed - eGFR in normal range and within 360 days    GFR calc Af Amer  Date Value Ref Range Status  05/19/2018 140 >59 mL/min/1.73 Final   GFR calc non Af Amer  Date Value Ref Range Status  05/19/2018 122 >59 mL/min/1.73 Final          Failed - Valid encounter within last 6 months    Recent Outpatient Visits           5 months ago Annual physical exam   [place], [place], PA-C   7 months ago Hypertensive urgency   [facility] [place], Clearnce Sorrel, [place]   1 year ago Annual physical exam   [facility] Carles Collet M, [place]

## 2019-12-13 NOTE — Telephone Encounter (Signed)
Not seen since 06/2019, I think this is for her PCOD and not DM.  Do you need to see her first?

## 2019-12-14 ENCOUNTER — Other Ambulatory Visit: Payer: Self-pay

## 2019-12-14 ENCOUNTER — Ambulatory Visit: Payer: Self-pay

## 2019-12-14 ENCOUNTER — Emergency Department: Payer: 59

## 2019-12-14 ENCOUNTER — Encounter: Payer: Self-pay | Admitting: Intensive Care

## 2019-12-14 ENCOUNTER — Emergency Department
Admission: EM | Admit: 2019-12-14 | Discharge: 2019-12-14 | Disposition: A | Discharge status: Home / Self Care | Payer: 59 | Attending: Emergency Medicine | Admitting: Emergency Medicine

## 2019-12-14 DIAGNOSIS — F17210 Nicotine dependence, cigarettes, uncomplicated: Secondary | ICD-10-CM | POA: Diagnosis not present

## 2019-12-14 DIAGNOSIS — Z7984 Long term (current) use of oral hypoglycemic drugs: Secondary | ICD-10-CM | POA: Diagnosis not present

## 2019-12-14 DIAGNOSIS — R55 Syncope and collapse: Secondary | ICD-10-CM | POA: Insufficient documentation

## 2019-12-14 DIAGNOSIS — R221 Localized swelling, mass and lump, neck: Secondary | ICD-10-CM | POA: Diagnosis not present

## 2019-12-14 DIAGNOSIS — I10 Essential (primary) hypertension: Secondary | ICD-10-CM | POA: Diagnosis not present

## 2019-12-14 HISTORY — DX: Essential (primary) hypertension: I10

## 2019-12-14 LAB — BASIC METABOLIC PANEL
Anion gap: 10 (ref 5–15)
BUN: 21 mg/dL — ABNORMAL HIGH (ref 6–20)
CO2: 25 mmol/L (ref 22–32)
Calcium: 9.3 mg/dL (ref 8.9–10.3)
Chloride: 103 mmol/L (ref 98–111)
Creatinine, Ser: 0.49 mg/dL (ref 0.44–1.00)
GFR calc Af Amer: >60 mL/min (ref >60)
GFR calc non Af Amer: >60 mL/min (ref >60)
Glucose, Bld: 99 mg/dL (ref 70–99)
Potassium: 4.2 mmol/L (ref 3.5–5.1)
Sodium: 138 mmol/L (ref 135–145)

## 2019-12-14 LAB — URINALYSIS, COMPLETE (UACMP) WITH MICROSCOPIC
Bilirubin Urine: NEGATIVE
Glucose, UA: NEGATIVE mg/dL
Hgb urine dipstick: NEGATIVE
Ketones, ur: NEGATIVE mg/dL
Nitrite: NEGATIVE
Protein, ur: NEGATIVE mg/dL
Specific Gravity, Urine: 1.013 (ref 1.005–1.030)
pH: 9 — ABNORMAL HIGH (ref 5.0–8.0)

## 2019-12-14 LAB — POC URINE PREG, ED: Preg Test, Ur: NEGATIVE

## 2019-12-14 LAB — CBC
HCT: 41.9 % (ref 36.0–46.0)
Hemoglobin: 14 g/dL (ref 12.0–15.0)
MCH: 30.8 pg (ref 26.0–34.0)
MCHC: 33.4 g/dL (ref 30.0–36.0)
MCV: 92.1 fL (ref 80.0–100.0)
Platelets: 333 10*3/uL (ref 150–400)
RBC: 4.55 MIL/uL (ref 3.87–5.11)
RDW: 12.2 % (ref 11.5–15.5)
WBC: 10.2 10*3/uL (ref 4.0–10.5)
nRBC: 0 % (ref 0.0–0.2)

## 2019-12-14 LAB — TSH: TSH: 0.412 u[IU]/mL (ref 0.350–4.500)

## 2019-12-14 MED ORDER — IOHEXOL 350 MG/ML SOLN
75.0000 mL | Freq: Once | INTRAVENOUS | Status: AC | PRN
  Administered 2019-12-14: 09:00:00 75 mL via INTRAVENOUS

## 2019-12-14 MED ORDER — ONDANSETRON HCL 4 MG/2ML IJ SOLN: 4.0000 mg | Freq: Once | INTRAMUSCULAR | Status: DC

## 2019-12-14 NOTE — ED Notes (Signed)
E-signature not working at this time. Pt verbalized understanding of D/C instructions, prescriptions and follow up care with no further questions at this time. Pt in NAD and ambulatory at time of D/C.  

## 2019-12-14 NOTE — Discharge Instructions (Signed)
You should be called by oncology today for a follow-up appointment within the next several days.  If you do not hear from oncology by 4 PM today please call them at the number provided to arrange a follow-up appointment within the next several days.  Please call the number provided for Dr. Gwen Pounds to arrange a follow-up appointment for repeat evaluation and possible Holter monitoring once again.  Return to the emergency department for any further syncopal events of (passing out), chest pain, weakness or numbness of any arm or leg, or any other symptom personally concerning to yourself.

## 2019-12-14 NOTE — ED Provider Notes (Signed)
[facility] Emergency Department Provider Note  Time seen: 9:05 AM  I have reviewed the triage vital signs and the nursing notes.   HISTORY  Chief Complaint Loss of Consciousness   HPI Brittany Walter is a 34 y.o. female with a past medical history of SVT status post ablation presents to the emergency department after syncopal episode.  According to the patient she was crouched down and felt a pain in her neck and then upon standing felt lightheaded and lost consciousness.  Patient denies any history of syncope in the past.  Patient states mild headache/left neck pain at this time.   Denies any vomiting although states she did get a wave of nausea prior to the syncopal event.  Denies any chest pain or palpitations now or at any point.  Patient states a history of SVT status post ablation in 2011.  Followed up with Dr. Nehemiah Massed last year with a Holter that was normal per patient.  Denies any recent illnesses fever cough congestion or shortness of breath.  Past Medical History:  Diagnosis Date  . Allergy    Seasonal  . PCOS (polycystic ovarian syndrome)   . Supraventricular tachycardia [facility])     Patient Active Problem List   Diagnosis Date Noted  . PCOS (polycystic ovarian syndrome) 06/17/2019  . PSVT (paroxysmal supraventricular tachycardia) ([place]) 05/25/2019  . Palpitations 05/25/2019  . Hyperlipidemia, mixed 05/25/2019  . Benign essential HTN 05/25/2019  . Hypertension 08/24/2015  . Acute anxiety 08/24/2015  . History of cardiac radiofrequency ablation 08/24/2015    Past Surgical History:  Procedure Laterality Date  . CARDIAC ELECTROPHYSIOLOGY MAPPING AND ABLATION  2011    Prior to Admission medications   Medication Sig Start Date End Date Taking? Authorizing Provider  metFORMIN (GLUCOPHAGE-XR) 500 MG 24 hr tablet TAKE 1 TABLET(500 MG) BY MOUTH DAILY WITH BREAKFAST 12/13/19   Trinna Post, PA-C    No Known Allergies  Family History  Adopted:  Yes  Family history unknown: Yes    Social History Social History   Tobacco Use  . Smoking status: Current Every Day Smoker    Types: Cigarettes  . Smokeless tobacco: Never Used  Substance Use Topics  . Alcohol use: No  . Drug use: No    Review of Systems Constitutional: Negative for fever. Cardiovascular: Negative for chest pain. Respiratory: Negative for shortness of breath. Gastrointestinal: Negative for abdominal pain Musculoskeletal: Negative for musculoskeletal complaints Skin: Negative for skin complaints  Neurological: Mild headache.  No weakness numbness or tingling. All other ROS negative  ____________________________________________   PHYSICAL EXAM:  VITAL SIGNS: ED Triage Vitals [12/14/19 0832]  Enc Vitals Group     BP 124/80     Pulse Rate 100     Resp 16     Temp 98.4 F (36.9 C)     Temp Source Oral     SpO2 97 %     Weight 150 lb (68 kg)     Height 4\' 11"  (1.499 m)     Head Circumference      Peak Flow      Pain Score 7     Pain Loc      Pain Edu?      Excl. in [place]?    Constitutional: Alert and oriented. Well appearing and in no distress. Eyes: Normal exam ENT      Head: Normocephalic and atraumatic.      Mouth/Throat: Mucous membranes are moist. Cardiovascular: Normal rate, regular rhythm. Respiratory: Normal  respiratory effort without tachypnea nor retractions. Breath sounds are clear  Gastrointestinal: Soft and nontender. No distention.  Musculoskeletal: Nontender with normal range of motion in all extremities.  Neurologic:  Normal speech and language. No gross focal neurologic deficits.  Equal grip strength bilaterally Skin:  Skin is warm, dry and intact.  Psychiatric: Mood and affect are normal.  ____________________________________________    EKG  EKG viewed and interpreted by myself shows a normal sinus rhythm at 95 bpm with a narrow QRS, normal axis, normal intervals, no concerning ST  changes.  ____________________________________________    RADIOLOGY  No significant findings as far as the CTA portion of the exam however patient unfortunately appears to have several enlarged lymph nodes with metastatic looking appearance.  ____________________________________________   INITIAL IMPRESSION / ASSESSMENT AND PLAN / ED COURSE  Pertinent labs & imaging results that were available during my care of the patient were reviewed by me and considered in my medical decision making (see chart for details).   Patient presents to the emergency department after a syncopal event that began after crouching and then standing.  Differential would include orthostatic syncope, dehydration, however given her neck pain and headache would also include aneurysmal bleed/ICH although considerably less likely.  I discussed with the patient options of work-up and we have agreed upon CTA of the head and neck in addition to basic labs.  Patient agreeable to plan of care.  Last menstrual period was 1 week ago.  CTA negative for any acute finding to explain the syncopal event however shows enlargement of the lymph nodes in the metastatic appearance with a possible enlargement of the thyroid gland.  Discussed this with the patient she has no neck pain, nontender on my exam.  No recent sore throat or tonsillitis.  I discussed the patient with Dr. Cathie Hoops of oncology, they will call the patient today to arrange a follow-up appointment for an expedited work-up.  Patient was adopted at 34 years old and has no knowledge of any family history.  I have added on a TSH per oncology which they will follow up with.  We will discharge the patient home with cardiology follow-up with Dr. Gwen Pounds for repeat Holter monitor if deemed appropriate.  I discussed return precautions.  Patient agreeable to plan of care.  Avani Sensabaugh was evaluated in Emergency Department on 12/14/2019 for the symptoms described in the history of present  illness. She was evaluated in the context of the global COVID-19 pandemic, which necessitated consideration that the patient might be at risk for infection with the SARS-CoV-2 virus that causes COVID-19. Institutional protocols and algorithms that pertain to the evaluation of patients at risk for COVID-19 are in a state of rapid change based on information released by regulatory bodies including the CDC and federal and state organizations. These policies and algorithms were followed during the patient's care in the ED.  ____________________________________________   FINAL CLINICAL IMPRESSION(S) / ED DIAGNOSES  Syncope Headache Neck mass   Minna Antis, MD 12/14/19 1028

## 2019-12-14 NOTE — ED Triage Notes (Signed)
Patient reports having syncopal episode this AM around 6:45. Reports she was reaching up for something in the cabinet and then felt a sharp pain in left side of neck and the next thing she remembers is waking up on the floor. Reports pain in left side of neck and back of head. A&O x4 in triage. Ambulatory with no problems

## 2019-12-14 NOTE — Telephone Encounter (Signed)
Pt. Reports she was home alone this morning. Was bending down reaching for something in her pantry, she hurt her neck. Stood up and went in the kitchen. Became nauseated and dizzy. Woke up on the floor. The back of her head hurts and her neck. States she was on the phone with her husband and estimates she was out for 3 minutes. Instructed to go to ED for evaluation. Will have her husband take her.  Reason for Disposition . Patient sounds very sick or weak to the triager  Answer Assessment - Initial Assessment Questions 1. ONSET: "How long were you unconscious?" (minutes) "When did it happen?"     Maybe 3 minutes 2. CONTENT: "What happened during period of unconsciousness?" (e.g., seizure activity)      Unsure 3. MENTAL STATUS: "Alert and oriented now?" (oriented x 3 = name, month, location)      Alert 4. TRIGGER: "What do you think caused the fainting?" "What were you doing just before you fainted?"  (e.g., exercise, sudden standing up, prolonged standing)     Bending over and hurt neck. Stood up, became dizzy and fainted. 5. RECURRENT SYMPTOM: "Have you ever passed out before?" If so, ask: "When was the last time?" and "What happened that time?"      No 6. INJURY: "Did you sustain any injury during the fall?"      Neck pain and headache 7. CARDIAC SYMPTOMS: "Have you had any of the following symptoms: chest pain, difficulty breathing, palpitations?"     No 8. NEUROLOGIC SYMPTOMS: "Have you had any of the following symptoms: headache, numbness, vertigo, weakness?"     Headache 9. GI SYMPTOMS: "Have you had any of the following symptoms: abdominal pain, vomiting, diarrhea, blood in stools?"     No 10. OTHER SYMPTOMS: "Do you have any other symptoms?"       No 11. PREGNANCY: "Is there any chance you are pregnant?" "When was your last menstrual period?"       No  Protocols used: [facility]

## 2019-12-16 ENCOUNTER — Encounter: Payer: Self-pay | Admitting: Oncology

## 2019-12-16 DIAGNOSIS — I10 Essential (primary) hypertension: Secondary | ICD-10-CM | POA: Diagnosis not present

## 2019-12-16 DIAGNOSIS — E041 Nontoxic single thyroid nodule: Secondary | ICD-10-CM | POA: Diagnosis not present

## 2019-12-16 DIAGNOSIS — R7989 Other specified abnormal findings of blood chemistry: Secondary | ICD-10-CM | POA: Diagnosis not present

## 2019-12-16 DIAGNOSIS — R946 Abnormal results of thyroid function studies: Secondary | ICD-10-CM | POA: Diagnosis not present

## 2019-12-16 DIAGNOSIS — F17210 Nicotine dependence, cigarettes, uncomplicated: Secondary | ICD-10-CM | POA: Diagnosis not present

## 2019-12-16 DIAGNOSIS — R55 Syncope and collapse: Secondary | ICD-10-CM | POA: Diagnosis not present

## 2019-12-16 DIAGNOSIS — R590 Localized enlarged lymph nodes: Secondary | ICD-10-CM | POA: Diagnosis not present

## 2019-12-16 NOTE — Progress Notes (Signed)
Patient contacted for MD visit on 2/12. Family history is unknown as she is adopted. Pt reports she has no pain or any concerns. Pt thought she was going to have biopsy tomorrow, but it was clarified that it was just a consult. Pt voiced understanding.

## 2019-12-17 ENCOUNTER — Inpatient Hospital Stay: Payer: No Typology Code available for payment source

## 2019-12-17 ENCOUNTER — Inpatient Hospital Stay: Payer: No Typology Code available for payment source | Attending: Oncology | Admitting: Oncology

## 2019-12-17 ENCOUNTER — Other Ambulatory Visit: Payer: Self-pay

## 2019-12-17 VITALS — BP 132/93 | HR 81 | Temp 97.6°F | Resp 18 | Wt 149.7 lb

## 2019-12-17 DIAGNOSIS — E041 Nontoxic single thyroid nodule: Secondary | ICD-10-CM

## 2019-12-17 DIAGNOSIS — E8809 Other disorders of plasma-protein metabolism, not elsewhere classified: Secondary | ICD-10-CM

## 2019-12-17 DIAGNOSIS — R7989 Other specified abnormal findings of blood chemistry: Secondary | ICD-10-CM | POA: Insufficient documentation

## 2019-12-17 DIAGNOSIS — Z720 Tobacco use: Secondary | ICD-10-CM

## 2019-12-17 DIAGNOSIS — R590 Localized enlarged lymph nodes: Secondary | ICD-10-CM | POA: Diagnosis not present

## 2019-12-17 DIAGNOSIS — R946 Abnormal results of thyroid function studies: Secondary | ICD-10-CM | POA: Insufficient documentation

## 2019-12-17 DIAGNOSIS — R779 Abnormality of plasma protein, unspecified: Secondary | ICD-10-CM

## 2019-12-17 DIAGNOSIS — R55 Syncope and collapse: Secondary | ICD-10-CM

## 2019-12-17 DIAGNOSIS — I10 Essential (primary) hypertension: Secondary | ICD-10-CM

## 2019-12-17 DIAGNOSIS — F17210 Nicotine dependence, cigarettes, uncomplicated: Secondary | ICD-10-CM

## 2019-12-17 LAB — HEPATIC FUNCTION PANEL
ALT: 21 U/L (ref 0–44)
AST: 20 U/L (ref 15–41)
Albumin: 5.6 g/dL — ABNORMAL HIGH (ref 3.5–5.0)
Alkaline Phosphatase: 38 U/L (ref 38–126)
Bilirubin, Direct: <0.1 mg/dL (ref 0.0–0.2)
Total Bilirubin: 0.6 mg/dL (ref 0.3–1.2)
Total Protein: 9.2 g/dL — ABNORMAL HIGH (ref 6.5–8.1)

## 2019-12-18 ENCOUNTER — Encounter: Payer: Self-pay | Admitting: Oncology

## 2019-12-18 LAB — THYROID PANEL WITH TSH
Free Thyroxine Index: 2.2 (ref 1.2–4.9)
T3 Uptake Ratio: 21 % — ABNORMAL LOW (ref 24–39)
T4, Total: 10.3 ug/dL (ref 4.5–12.0)
TSH: 1.47 u[IU]/mL (ref 0.450–4.500)

## 2019-12-18 NOTE — Progress Notes (Signed)
Hematology/Oncology Consult note [facility] [phone] [phone]   Patient Care Team: Maryella Shivers as PCP - General (Physician Assistant)  REFERRING PROVIDER: Minna Antis, MD  CHIEF COMPLAINTS/REASON FOR VISIT:  Evaluation of thyroid mass and cervical lymphadenopathy  HISTORY OF PRESENTING ILLNESS:   Brittany Walter is a  34 y.o.  female with PMH listed below was seen in consultation at the request of  Minna Antis, MDfor evaluation of thyroid mass and cervical lymphadenopathy Patient recently presented to emergency room after syncope episode.  She has a history of SVT status post ablation in 2011.  She follows up with Dr. Rhae Lerner last year and had a Holter study which was reported to be normal. She reports that she was in the pantry and was crouched down and felt pain in her neck.  When she stood up, she felt lightheaded and lost consciousness.  She woke up lying on the floor in the kitchen. In the emergency room, CT head and the neck angiogram was obtained.  12/14/2019 CT head without contrast showed normal experience of the brain for age.  No evidence of acute intracranial abnormality. CT angiogram neck with and without contrast showed bilateral common carotid, internal carotid and vertebral arteries are patent within the neck without stenosis.  No evidence of dissection or pseudoaneurysm. Incidental findings of 2 cm heterogeneously calcified right thyroid lobe nodule.  Right level 2 and 3 lymphadenopathy as described with some nodes demonstrating regions of nodular enhancement and a cystic change. CT angiogram of the head showed no intracranial large vessel occlusion or proximal high-grade arterial stenosis.  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  Patient was referred to establish care with oncology for further evaluation.                                   Patient was accompanied  by her husband Brittany Walter today She denies any new complaints. Denies any headache, shortness of breath, swallowing difficulties. Patient reports that she was adopted and not aware of any family history. Review of Systems  Constitutional: Negative for appetite change, chills, fatigue and fever.  HENT:   Negative for hearing loss and voice change.   Eyes: Negative for eye problems.  Respiratory: Negative for chest tightness and cough.   Cardiovascular: Negative for chest pain.  Gastrointestinal: Negative for abdominal distention, abdominal pain and blood in stool.  Endocrine: Negative for hot flashes.  Genitourinary: Negative for difficulty urinating and frequency.   Musculoskeletal: Negative for arthralgias.  Skin: Negative for itching and rash.  Neurological: Negative for extremity weakness.  Hematological: Negative for adenopathy.  Psychiatric/Behavioral: Negative for confusion.    MEDICAL HISTORY:  Past Medical History:  Diagnosis Date  . Allergy    Seasonal  . Hypertension   . PCOS (polycystic ovarian syndrome)   . Supraventricular tachycardia (HCC)     SURGICAL HISTORY: Past Surgical History:  Procedure Laterality Date  . CARDIAC ELECTROPHYSIOLOGY MAPPING AND ABLATION  2011    SOCIAL HISTORY: Social History   Socioeconomic History  . Marital status: Married    Spouse name: Not on file  . Number of children: Not on file  . Years of education: Not on file  . Highest education level: Not on file  Occupational History  . Not on file  Tobacco Use  . Smoking status: Current Every Day Smoker    Packs/day:  0.50    Years: 10.00    Pack years: 5.00    Types: Cigarettes  . Smokeless tobacco: Never Used  Substance and Sexual Activity  . Alcohol use: No  . Drug use: No  . Sexual activity: Yes  Other Topics Concern  . Not on file  Social History Narrative  . Not on file   Social Determinants of Health   Financial Resource Strain:   . Difficulty of Paying  Living Expenses: Not on file  Food Insecurity:   . Worried About Charity fundraiser in the Last Year: Not on file  . Ran Out of Food in the Last Year: Not on file  Transportation Needs:   . Lack of Transportation (Medical): Not on file  . Lack of Transportation (Non-Medical): Not on file  Physical Activity:   . Days of Exercise per Week: Not on file  . Minutes of Exercise per Session: Not on file  Stress:   . Feeling of Stress : Not on file  Social Connections:   . Frequency of Communication with Friends and Family: Not on file  . Frequency of Social Gatherings with Friends and Family: Not on file  . Attends Religious Services: Not on file  . Active Member of Clubs or Organizations: Not on file  . Attends Archivist Meetings: Not on file  . Marital Status: Not on file  Intimate Partner Violence:   . Fear of Current or Ex-Partner: Not on file  . Emotionally Abused: Not on file  . Physically Abused: Not on file  . Sexually Abused: Not on file    FAMILY HISTORY: Family History  Adopted: Yes  Family history unknown: Yes    ALLERGIES:  has No Known Allergies.  MEDICATIONS:  Current Outpatient Medications  Medication Sig Dispense Refill  . metFORMIN (GLUCOPHAGE-XR) 500 MG 24 hr tablet TAKE 1 TABLET(500 MG) BY MOUTH DAILY WITH BREAKFAST (Patient not taking: No sig reported) 90 tablet 1   No current facility-administered medications for this visit.     PHYSICAL EXAMINATION: ECOG PERFORMANCE STATUS: 0 - Asymptomatic Vitals:   12/17/19 1048  BP: (!) 132/93  Pulse: 81  Resp: 18  Temp: 97.6 F (36.4 C)  SpO2: 100%   Filed Weights   12/17/19 1048  Weight: 149 lb 11.2 oz (67.9 kg)    Physical Exam Constitutional:      General: She is not in acute distress. HENT:     Head: Normocephalic and atraumatic.  Eyes:     General: No scleral icterus. Neck:     Comments: I did not palpate discrete thyroid mass or cervical lymphadenopathy.  Cardiovascular:      Rate and Rhythm: Normal rate and regular rhythm.     Heart sounds: Normal heart sounds.  Pulmonary:     Effort: Pulmonary effort is normal. No respiratory distress.     Breath sounds: No wheezing.  Abdominal:     General: Bowel sounds are normal. There is no distension.     Palpations: Abdomen is soft.  Musculoskeletal:        General: No deformity. Normal range of motion.     Cervical back: Normal range of motion and neck supple.  Skin:    General: Skin is warm and dry.     Findings: No erythema or rash.  Neurological:     Mental Status: She is alert and oriented to person, place, and time. Mental status is at baseline.     Cranial Nerves: No cranial  nerve deficit.     Coordination: Coordination normal.  Psychiatric:        Mood and Affect: Mood normal.     LABORATORY DATA:  I have reviewed the data as listed Lab Results  Component Value Date   WBC 10.2 12/14/2019   HGB 14.0 12/14/2019   HCT 41.9 12/14/2019   MCV 92.1 12/14/2019   PLT 333 12/14/2019   Recent Labs    12/14/19 0839 12/17/19 1128  NA 138  --   K 4.2  --   CL 103  --   CO2 25  --   GLUCOSE 99  --   BUN 21*  --   CREATININE 0.49  --   CALCIUM 9.3  --   GFRNONAA >60  --   GFRAA >60  --   PROT  --  9.2*  ALBUMIN  --  5.6*  AST  --  20  ALT  --  21  ALKPHOS  --  38  BILITOT  --  0.6  BILIDIR  --  <0.1  IBILI  --  NOT CALCULATED   Iron/TIBC/Ferritin/ %Sat No results found for: IRON, TIBC, FERRITIN, IRONPCTSAT    RADIOGRAPHIC STUDIES: I have personally reviewed the radiological images as listed and agreed with the findings in the report.  CT Angio Head W or Wo Contrast  Result Date: 12/14/2019 CLINICAL DATA:  Syncope after neck pain, now with headache; syncope, no other cardiac signs/symptoms. Additional history provided: EXAM: CT ANGIOGRAPHY HEAD AND NECK TECHNIQUE: Multidetector CT imaging of the head and neck was performed using the standard protocol during bolus administration of intravenous  contrast. Multiplanar CT image reconstructions and MIPs were obtained to evaluate the vascular anatomy. Carotid stenosis measurements (when applicable) are obtained utilizing NASCET criteria, using the distal internal carotid diameter as the denominator. CONTRAST:  12mL OMNIPAQUE IOHEXOL 350 MG/ML SOLN COMPARISON:  No pertinent prior studies available for comparison. FINDINGS: FINDINGS CT HEAD FINDINGS Brain: No evidence of acute intracranial hemorrhage. No demarcated cortical infarction. No evidence of intracranial mass. No midline shift or extra-axial fluid collection. Cerebral volume is normal for age. Vascular: Reported separately. Skull: Normal. Negative for fracture or focal lesion. Sinuses: No significant paranasal sinus disease or mastoid effusion. Orbits: Visualized orbits demonstrate no acute abnormality. Review of the MIP images confirms the above findings CTA NECK FINDINGS Aortic arch: Standard aortic branching. Trace calcified plaque within the visualized aortic arch. No innominate or proximal subclavian artery stenosis. Right carotid system: CCA and ICA smooth and patent within the neck without stenosis. Left carotid system: CCA and ICA smooth and patent within the neck without stenosis. Vertebral arteries: Codominant. The vertebral arteries are smooth and patent within the neck bilaterally without. Skeleton: No acute bony abnormality or aggressive appearing osseous lesion. Other neck: There is a 20 mm heterogeneously calcified right thyroid lobe nodule (series 10, image 115). There are heterogeneous enlarged right level III lymph nodes which demonstrate foci of nodular enhancement as well as foci of cystic change. The largest right level III lymph node measures 1.7 cm in short axis (series 12, image 74). An adjacent similar appearing right level III lymph measures 11 mm in short axis (series 12, image 74). Superior to this, there is a heterogeneously enhancing right level II lymph node measuring 9 mm  in short axis (series 10, image 164). There is also an adjacent right level II lymph node more superiorly which is asymmetrically mildly enlarged, measuring 12 mm in short axis (series 12, image 64).  Upper chest: No consolidation within the imaged lung apices. Review of the MIP images confirms the above findings CTA HEAD FINDINGS Anterior circulation: The intracranial internal carotid arteries are patent without significant stenosis. The M1 middle cerebral arteries are patent without significant stenosis. No M2 proximal branch occlusion or high-grade proximal arterial stenosis is identified. The anterior cerebral arteries are patent without high-grade proximal stenosis. 1-2 mm inferiorly projecting aneurysm versus infundibulum arising from the supraclinoid right ICA (series 12, image 88). Posterior circulation: The intracranial vertebral arteries are patent without significant stenosis, as is the basilar artery. The bilateral posterior cerebral arteries are patent without significant proximal stenosis. Posterior communicating arteries are poorly delineated and may be hypoplastic or absent bilaterally. Venous sinuses: Within limitations of contrast timing, no convincing thrombus. Anatomic variants: As described. Review of the MIP images confirms the above findings These results were called by telephone at the time of interpretation on 12/14/2019 at 10:12 am to provider [facility] , who verbally acknowledged these results. IMPRESSION: CT head: Normal noncontrast CT appearance of the brain for age. No evidence of acute intracranial abnormality. CTA neck: 1. The bilateral common carotid, internal carotid and vertebral arteries are patent within the neck without stenosis. No evidence of dissection or pseudoaneurysm. 2. 20 mm heterogeneously calcified right thyroid lobe nodule. Dedicated thyroid ultrasound is recommended for further evaluation. 3. Right level II and III lymphadenopathy as described with some nodes  demonstrating regions of nodular enhancement and cystic change. Findings likely reflect metastatic nodal disease, possibly from a thyroid primary. Direct tissue sampling is recommended for further evaluation, as clinically warranted. CTA head: 1. No intracranial large vessel occlusion or proximal high-grade arterial stenosis. 2. 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 Electronically Signed   By: Jackey LogeKyle  Golden DO   On: 12/14/2019 10:12   CT Angio Neck W and/or Wo Contrast  Result Date: 12/14/2019 CLINICAL DATA:  Syncope after neck pain, now with headache; syncope, no other cardiac signs/symptoms. Additional history provided: EXAM: CT ANGIOGRAPHY HEAD AND NECK TECHNIQUE: Multidetector CT imaging of the head and neck was performed using the standard protocol during bolus administration of intravenous contrast. Multiplanar CT image reconstructions and MIPs were obtained to evaluate the vascular anatomy. Carotid stenosis measurements (when applicable) are obtained utilizing NASCET criteria, using the distal internal carotid diameter as the denominator. CONTRAST:  75mL OMNIPAQUE IOHEXOL 350 MG/ML SOLN COMPARISON:  No pertinent prior studies available for comparison. FINDINGS: FINDINGS CT HEAD FINDINGS Brain: No evidence of acute intracranial hemorrhage. No demarcated cortical infarction. No evidence of intracranial mass. No midline shift or extra-axial fluid collection. Cerebral volume is normal for age. Vascular: Reported separately. Skull: Normal. Negative for fracture or focal lesion. Sinuses: No significant paranasal sinus disease or mastoid effusion. Orbits: Visualized orbits demonstrate no acute abnormality. Review of the MIP images confirms the above findings CTA NECK FINDINGS Aortic arch: Standard aortic branching. Trace calcified plaque within the visualized aortic arch. No innominate or proximal subclavian artery stenosis. Right carotid system: CCA and ICA smooth and patent  within the neck without stenosis. Left carotid system: CCA and ICA smooth and patent within the neck without stenosis. Vertebral arteries: Codominant. The vertebral arteries are smooth and patent within the neck bilaterally without. Skeleton: No acute bony abnormality or aggressive appearing osseous lesion. Other neck: There is a 20 mm heterogeneously calcified right thyroid lobe nodule (series 10, image 115). There are heterogeneous enlarged right level III lymph nodes which demonstrate foci of nodular enhancement as  well as foci of cystic change. The largest right level III lymph node measures 1.7 cm in short axis (series 12, image 74). An adjacent similar appearing right level III lymph measures 11 mm in short axis (series 12, image 74). Superior to this, there is a heterogeneously enhancing right level II lymph node measuring 9 mm in short axis (series 10, image 164). There is also an adjacent right level II lymph node more superiorly which is asymmetrically mildly enlarged, measuring 12 mm in short axis (series 12, image 64). Upper chest: No consolidation within the imaged lung apices. Review of the MIP images confirms the above findings CTA HEAD FINDINGS Anterior circulation: The intracranial internal carotid arteries are patent without significant stenosis. The M1 middle cerebral arteries are patent without significant stenosis. No M2 proximal branch occlusion or high-grade proximal arterial stenosis is identified. The anterior cerebral arteries are patent without high-grade proximal stenosis. 1-2 mm inferiorly projecting aneurysm versus infundibulum arising from the supraclinoid right ICA (series 12, image 88). Posterior circulation: The intracranial vertebral arteries are patent without significant stenosis, as is the basilar artery. The bilateral posterior cerebral arteries are patent without significant proximal stenosis. Posterior communicating arteries are poorly delineated and may be hypoplastic or  absent bilaterally. Venous sinuses: Within limitations of contrast timing, no convincing thrombus. Anatomic variants: As described. Review of the MIP images confirms the above findings These results were called by telephone at the time of interpretation on 12/14/2019 at 10:12 am to provider [facility] , who verbally acknowledged these results. IMPRESSION: CT head: Normal noncontrast CT appearance of the brain for age. No evidence of acute intracranial abnormality. CTA neck: 1. The bilateral common carotid, internal carotid and vertebral arteries are patent within the neck without stenosis. No evidence of dissection or pseudoaneurysm. 2. 20 mm heterogeneously calcified right thyroid lobe nodule. Dedicated thyroid ultrasound is recommended for further evaluation. 3. Right level II and III lymphadenopathy as described with some nodes demonstrating regions of nodular enhancement and cystic change. Findings likely reflect metastatic nodal disease, possibly from a thyroid primary. Direct tissue sampling is recommended for further evaluation, as clinically warranted. CTA head: 1. No intracranial large vessel occlusion or proximal high-grade arterial stenosis. 2. 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 Electronically Signed   By: Jackey Loge DO   On: 12/14/2019 10:12      ASSESSMENT & PLAN:  1. Thyroid nodule   2. Cervical lymphadenopathy   3. Tobacco use   4. Elevated blood protein   5. Syncope, unspecified syncope type    CT images were independently reviewed and discussed with patient and her husband. Right thyroid heterogeneous nodule with right cervical lymphadenopathy.  Concerning for thyroid cancer with nodal metastasis. Patient has TSH done in the emergency room which was 0.4, low normal end, I will repeat another thyroid function including TSH, T4 and free T3. Obtain ultrasound thyroid for further evaluation.  Discussed with patient that most likely she will  need to proceed with tissue diagnosis.  Further management pending thyroid function and thyroid ultrasound.  Patient had CBC and a BMP done on 12/14/2019 which were not remarkable..  Check liver function test LFT came back patient has increased albumin level 5.6, increased total protein level 9.2.  Etiology unknown. Inflammation vs infection vs plasma disorder. Check SPEP/IFE, HIV, hepatitis.   Tobacco use, smoke cessation was discussed with patient.  She is motivated. Syncope, she will follow up with her cardiologist.   Orders Placed This  Encounter  Procedures  . US Soft Tissue Head/Neck    Standing Status:   Future    Standing Expiration Date:   12/16/2020    Order Specific Question:   Reason for Exam (SYMPTOM  OR DIAGNOSIS REQUIRED)    Answer:   thyroid mass/cervical lymphadenopathy    Order Specific Question:   Preferred imaging location?    Answer:   [facility]  . US THYROID    Standing Status:   Future    Standing Expiration Date:   02/13/2021    Order Specific Question:   Reason for Exam (SYMPTOM  OR DIAGNOSIS REQUIRED)    Answer:   thyroid mass/cervical lymphadenopathy    Order Specific Question:   Preferred imaging location?    Answer:   [facility]  . Thyroid Panel With TSH    Standing Status:   Future    Number of Occurrences:   1    Standing Expiration Date:   12/16/2020  . Hepatic function panel    Standing Status:   Future    Number of Occurrences:   1    Standing Expiration Date:   12/16/2020    All questions were answered. The patient knows to call the clinic with any problems questions or concerns.   Return of visit: To be determined Thank you for this kind referral and the opportunity to participate in the care of this patient. A copy of today's note is routed to referring provider     Rickard Patience, MD, PhD Hematology Oncology [facility] Pager- [phone] 12/18/2019

## 2019-12-20 ENCOUNTER — Telehealth: Payer: Self-pay

## 2019-12-20 NOTE — Telephone Encounter (Signed)
-----   Message from Rickard Patience, MD sent at 12/18/2019  2:54 PM EST ----- Please arrange pt to have another lab encounter this week. Thanks

## 2019-12-20 NOTE — Telephone Encounter (Signed)
Labs has been scheduled as requested. Pt is aware.

## 2019-12-23 ENCOUNTER — Encounter: Payer: Self-pay | Admitting: *Deleted

## 2019-12-23 ENCOUNTER — Inpatient Hospital Stay: Payer: No Typology Code available for payment source

## 2019-12-23 ENCOUNTER — Other Ambulatory Visit: Payer: Self-pay

## 2019-12-23 ENCOUNTER — Ambulatory Visit
Admission: RE | Admit: 2019-12-23 | Discharge: 2019-12-23 | Disposition: A | Discharge status: Home / Self Care | Payer: 59 | Source: Ambulatory Visit | Attending: Oncology | Admitting: Oncology

## 2019-12-23 ENCOUNTER — Ambulatory Visit: Payer: 59

## 2019-12-23 DIAGNOSIS — E041 Nontoxic single thyroid nodule: Secondary | ICD-10-CM | POA: Insufficient documentation

## 2019-12-23 DIAGNOSIS — E8809 Other disorders of plasma-protein metabolism, not elsewhere classified: Secondary | ICD-10-CM

## 2019-12-23 DIAGNOSIS — R779 Abnormality of plasma protein, unspecified: Secondary | ICD-10-CM

## 2019-12-23 LAB — HIV ANTIBODY (ROUTINE TESTING W REFLEX): HIV Screen 4th Generation wRfx: NONREACTIVE

## 2019-12-23 LAB — HEPATITIS PANEL, ACUTE
HCV Ab: NONREACTIVE
Hep A IgM: NONREACTIVE
Hep B C IgM: NONREACTIVE
Hepatitis B Surface Ag: NONREACTIVE

## 2019-12-24 ENCOUNTER — Other Ambulatory Visit: Payer: Self-pay

## 2019-12-24 ENCOUNTER — Telehealth: Payer: Self-pay

## 2019-12-24 NOTE — Telephone Encounter (Signed)
-----   Message from Rickard Patience, MD sent at 12/23/2019  9:14 PM EST ----- I called patient and communicated the results with her.  Combining her thyroid function, I recommend patient to have tissue sampling to establish diagnosis. Recommend patient to establish care with [facility] Dr. Esmeralda Links for evaluation of biopsy and treatments.  Patient agrees with the plan. Please refer ASAP and help her to get an appt. Thanks.

## 2019-12-24 NOTE — Telephone Encounter (Signed)
Referral has been faxed to Dr. Ellwood Handler office. Request for them to contact with appt details.   Fx: [phone] Ph: [phone]

## 2019-12-27 ENCOUNTER — Encounter: Payer: Self-pay | Admitting: Oncology

## 2019-12-27 LAB — MULTIPLE MYELOMA PANEL, SERUM
Albumin SerPl Elph-Mcnc: 4.5 g/dL — ABNORMAL HIGH (ref 2.9–4.4)
Albumin/Glob SerPl: 1.6 (ref 0.7–1.7)
Alpha 1: 0.2 g/dL (ref 0.0–0.4)
Alpha2 Glob SerPl Elph-Mcnc: 0.6 g/dL (ref 0.4–1.0)
B-Globulin SerPl Elph-Mcnc: 0.9 g/dL (ref 0.7–1.3)
Gamma Glob SerPl Elph-Mcnc: 1.3 g/dL (ref 0.4–1.8)
Globulin, Total: 3 g/dL (ref 2.2–3.9)
IgA: 127 mg/dL (ref 87–352)
IgG (Immunoglobin G), Serum: 1296 mg/dL (ref 586–1602)
IgM (Immunoglobulin M), Srm: 82 mg/dL (ref 26–217)
Total Protein ELP: 7.5 g/dL (ref 6.0–8.5)

## 2020-03-16 NOTE — Progress Notes (Signed)
     Established patient visit   [name]   DOB: [birth year]   34 y.o. Female  MRN: [id number] Visit Date: 03/17/2020  Today's healthcare provider: Trey Sailors, PA-C   Chief Complaint  Patient presents with  . Headache   Subjective    Headache  This is a recurrent problem. The current episode started more than 1 month ago. The problem occurs monthly. The problem has been unchanged. The pain quality is similar to prior headaches. The quality of the pain is described as throbbing. The pain is at a severity of 7/10. The pain is severe. Pertinent negatives include no dizziness, eye pain, facial sweating, loss of balance, phonophobia, photophobia, scalp tenderness, visual change or weakness. The symptoms are aggravated by menstrual cycle and bright light. She has tried Excedrin for the symptoms. The treatment provided moderate relief.   Patient reports ongoing migraines for several months now typically the day before she gets her period.   Currently being treated for thyroid cancer. Had thyroidectomy and is receiving radioactive iodine therapy with [facility].      Medications: Outpatient Medications Prior to Visit  Medication Sig  . metFORMIN (GLUCOPHAGE-XR) 500 MG 24 hr tablet TAKE 1 TABLET(500 MG) BY MOUTH DAILY WITH BREAKFAST (Patient not taking: No sig reported)   No facility-administered medications prior to visit.    Review of Systems  Eyes: Negative for photophobia and pain.  Neurological: Positive for headaches. Negative for dizziness, weakness and loss of balance.      Objective    There were no vitals taken for this visit.   Physical Exam    No results found for any visits on 03/17/20.  Assessment & Plan    1. Other migraine without status migrainosus, not intractable  Counseled on use, no more than 2x/wk and 8x/month. If using frequently, will need to consider prophylactic. Follow up PRN.   - SUMAtriptan (IMITREX) 50 MG tablet; May repeat in 2  hours if headache persists or recurs.  Dispense: 10 tablet; Refill: 1      I, Trey Sailors, PA-C, have reviewed all documentation for this visit. The documentation on 03/17/20 for the exam, diagnosis, procedures, and orders are all accurate and complete.    Maryella Shivers  [facility] [phone] (phone) [phone] (fax)  [facility]

## 2020-03-17 ENCOUNTER — Ambulatory Visit (INDEPENDENT_AMBULATORY_CARE_PROVIDER_SITE_OTHER): Payer: 59 | Admitting: Physician Assistant

## 2020-03-17 DIAGNOSIS — G43809 Other migraine, not intractable, without status migrainosus: Secondary | ICD-10-CM | POA: Diagnosis not present

## 2020-03-17 MED ORDER — SUMATRIPTAN SUCCINATE 50 MG PO TABS: ORAL_TABLET | ORAL | 1 refills | Status: DC

## 2020-03-17 NOTE — Patient Instructions (Signed)

## 2020-09-06 ENCOUNTER — Encounter: Payer: Self-pay | Admitting: Physician Assistant

## 2020-09-06 DIAGNOSIS — G43809 Other migraine, not intractable, without status migrainosus: Secondary | ICD-10-CM

## 2020-09-07 MED ORDER — SUMATRIPTAN SUCCINATE 50 MG PO TABS: ORAL_TABLET | ORAL | 1 refills | Status: DC

## 2020-10-17 IMAGING — CT CT ANGIO NECK
1 of 13 series · 4 of 33 positions shown · IV contrast (APPLIED)
Comparison: No pertinent prior studies available for comparison.

CLINICAL DATA: Syncope after neck pain, now with headache; syncope,
no other cardiac signs/symptoms. Additional history provided:

EXAM:
CT ANGIOGRAPHY HEAD AND NECK
TECHNIQUE: Multidetector CT imaging of the head and neck was performed using
the standard protocol during bolus administration of intravenous
contrast. Multiplanar CT image reconstructions and MIPs were
obtained to evaluate the vascular anatomy. Carotid stenosis
measurements (when applicable) are obtained utilizing NASCET
criteria, using the distal internal carotid diameter as the
denominator.
CONTRAST:  75mL OMNIPAQUE IOHEXOL 350 MG/ML SOLN

[Series 517: cta head neck thins · axial · 0.39mm/px · z∈[-243,-28]mm · 4 of 716 slices shown]
[im 144/716  soft-tissue]
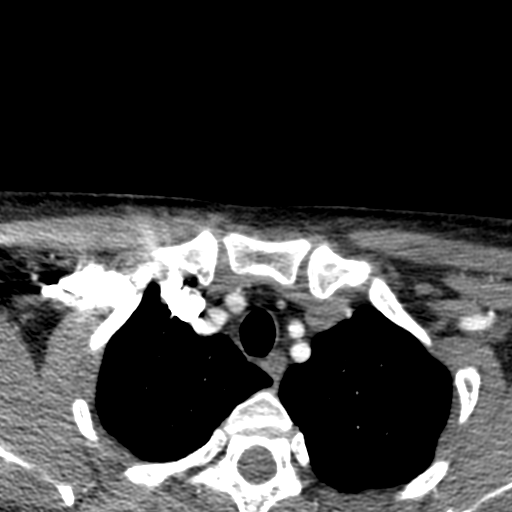
[im 287/716  bone]
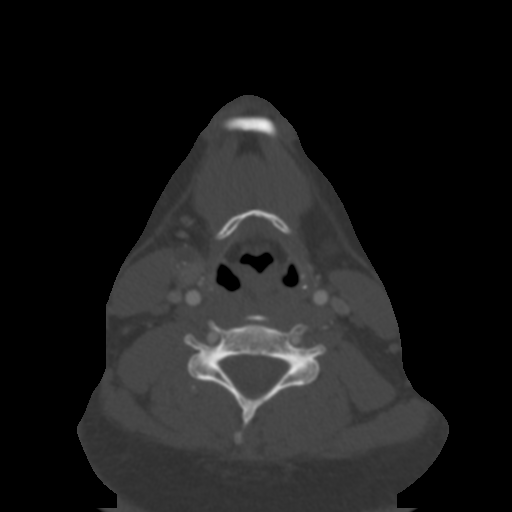
[im 430/716  soft-tissue]
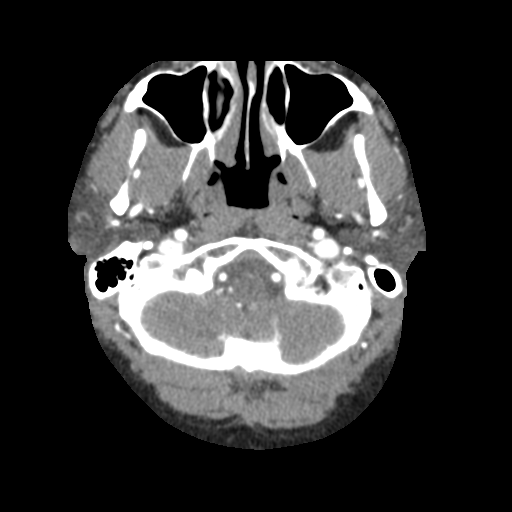
[im 573/716  bone]
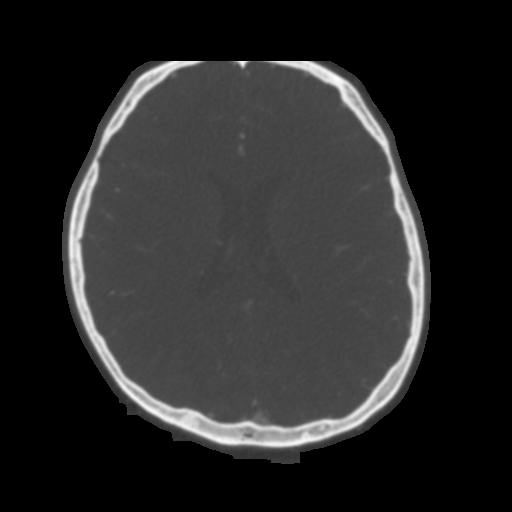

[4 of 33 positions shown; findings below may reference images not displayed]

FINDINGS: FINDINGS
CT HEAD FINDINGS

Brain:

No evidence of acute intracranial hemorrhage.

No demarcated cortical infarction.

No evidence of intracranial mass.

No midline shift or extra-axial fluid collection.

Cerebral volume is normal for age.

Vascular: Reported separately.

Skull: Normal. Negative for fracture or focal lesion.

Sinuses: No significant paranasal sinus disease or mastoid effusion.

Orbits: Visualized orbits demonstrate no acute abnormality.

Review of the MIP images confirms the above findings

CTA NECK FINDINGS

Aortic arch: Standard aortic branching. Trace calcified plaque
within the visualized aortic arch. No innominate or proximal
subclavian artery stenosis.

Right carotid system: CCA and ICA smooth and patent within the neck
without stenosis.

Left carotid system: CCA and ICA smooth and patent within the neck
without stenosis.

Vertebral arteries: Codominant. The vertebral arteries are smooth
and patent within the neck bilaterally without.

Skeleton: No acute bony abnormality or aggressive appearing osseous
lesion.

Other neck: There is a 20 mm heterogeneously calcified right thyroid
lobe nodule (series 10, image 115). There are heterogeneous enlarged
right level III lymph nodes which demonstrate foci of nodular
enhancement as well as foci of cystic change. The largest right
level III lymph node measures 1.7 cm in short axis (series 12, image
74). An adjacent similar appearing right level III lymph measures 11
mm in short axis (series 12, image 74). Superior to this, there is a
heterogeneously enhancing right level II lymph node measuring 9 mm
in short axis (series 10, image 164). There is also an adjacent
right level II lymph node more superiorly which is asymmetrically
mildly enlarged, measuring 12 mm in short axis (series 12, image
64).

Upper chest: No consolidation within the imaged lung apices.

Review of the MIP images confirms the above findings

CTA HEAD FINDINGS

Anterior circulation:

The intracranial internal carotid arteries are patent without
significant stenosis.

The M1 middle cerebral arteries are patent without significant
stenosis. No M2 proximal branch occlusion or high-grade proximal
arterial stenosis is identified. The anterior cerebral arteries are
patent without high-grade proximal stenosis. 1-2 mm inferiorly
projecting aneurysm versus infundibulum arising from the
supraclinoid right ICA (series 12, image 88).

Posterior circulation:

The intracranial vertebral arteries are patent without significant
stenosis, as is the basilar artery. The bilateral posterior cerebral
arteries are patent without significant proximal stenosis. Posterior
communicating arteries are poorly delineated and may be hypoplastic
or absent bilaterally.

Venous sinuses: Within limitations of contrast timing, no convincing
thrombus.

Anatomic variants: As described.

Review of the MIP images confirms the above findings

These results were called by telephone at the time of interpretation
on 12/14/2019 at [DATE] to provider DINAH SARDINA , who verbally
acknowledged these results.
IMPRESSION: CT head:

Normal noncontrast CT appearance of the brain for age. No evidence
of acute intracranial abnormality.

CTA neck:

1. The bilateral common carotid, internal carotid and vertebral
arteries are patent within the neck without stenosis. No evidence of
dissection or pseudoaneurysm.
2. 20 mm heterogeneously calcified right thyroid lobe nodule.
Dedicated thyroid ultrasound is recommended for further evaluation.
3. Right level II and III lymphadenopathy as described with some
nodes demonstrating regions of nodular enhancement and cystic
change. Findings likely reflect metastatic nodal disease, possibly
from a thyroid primary. Direct tissue sampling is recommended for
further evaluation, as clinically warranted.

CTA head:

1. No intracranial large vessel occlusion or proximal high-grade
arterial stenosis.
2. 1-2 mm aneurysm versus infundibulum arising from the supraclinoid
right ICA.

## 2020-10-26 IMAGING — US US THYROID
1 series · 13 of 25 positions shown · non-contrast
Comparison: Neck CTA-12/14/2019

CLINICAL DATA: Palpable abnormality. Thyroid mass. Cervical
lymphadenopathy.

EXAM:
THYROID ULTRASOUND
TECHNIQUE: Ultrasound examination of the thyroid gland and adjacent soft
tissues was performed.

[Series 1: us thyroid · 13 of 79 slices shown]
[im 1/79]
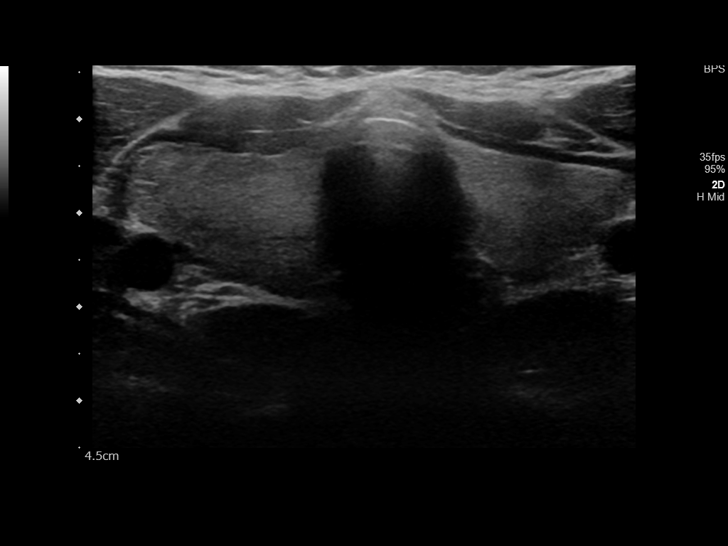
[im 7/79]
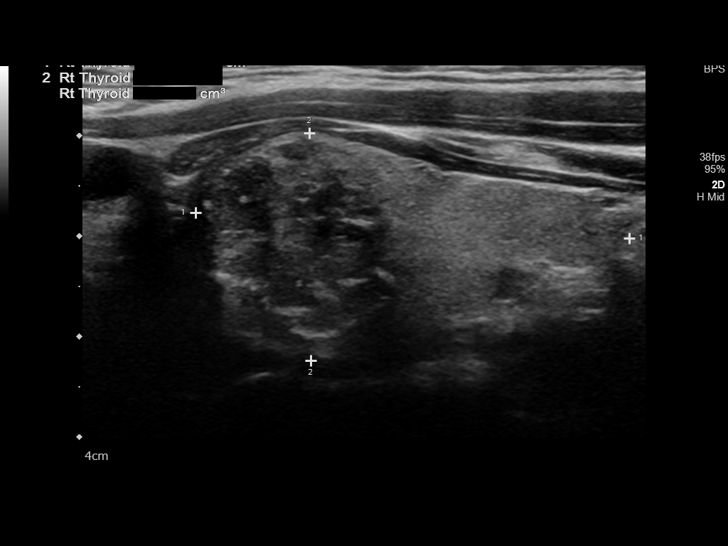
[im 14/79]
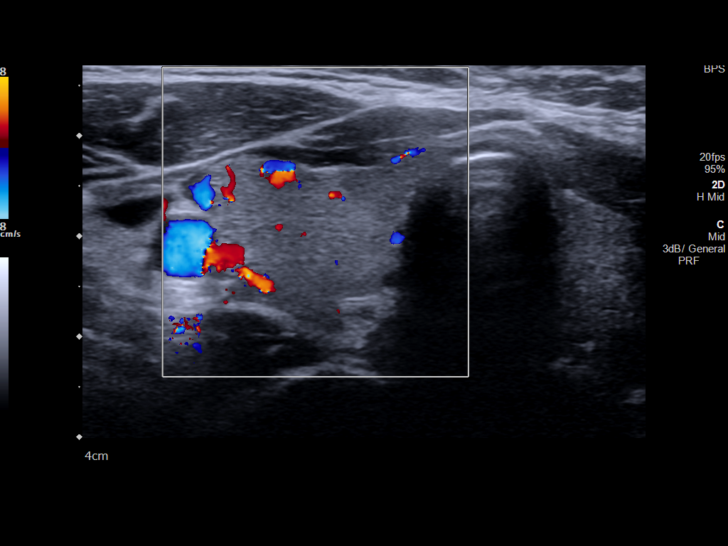
[im 20/79]
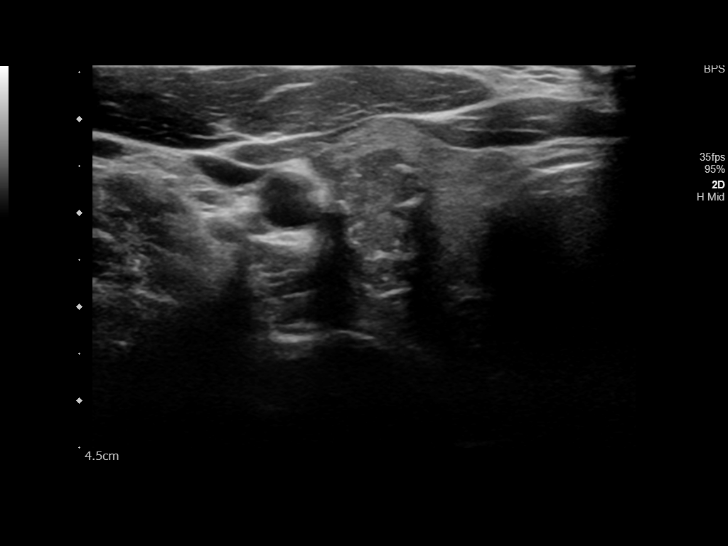
[im 27/79]
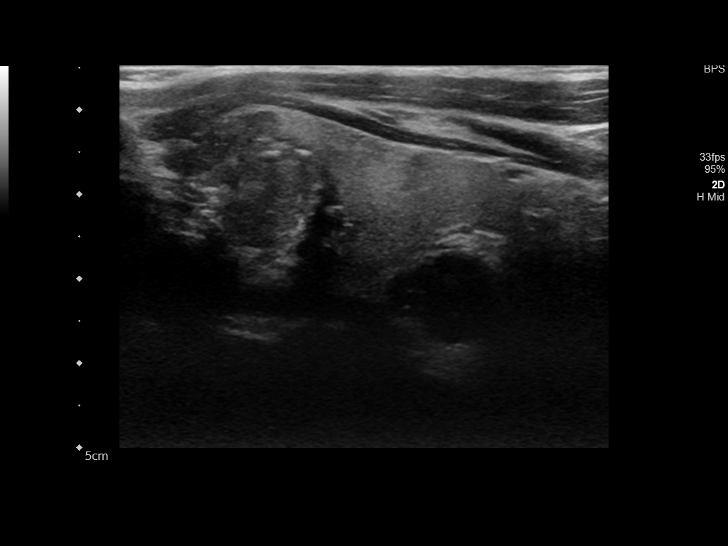
[im 33/79]
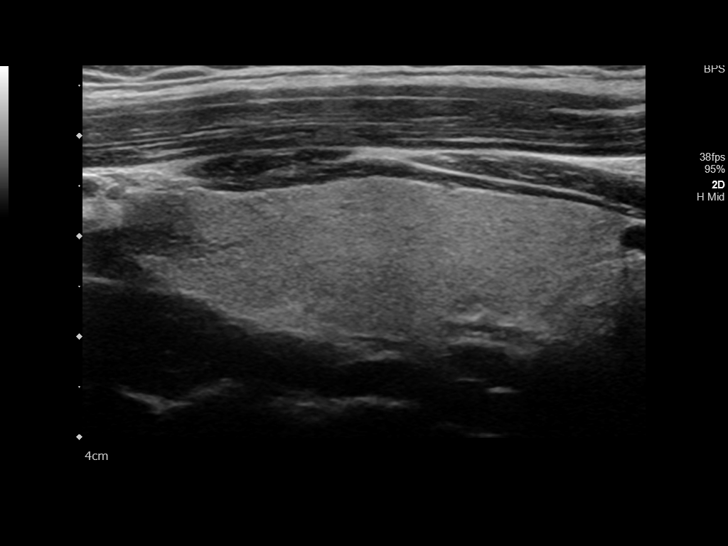
[im 40/79]
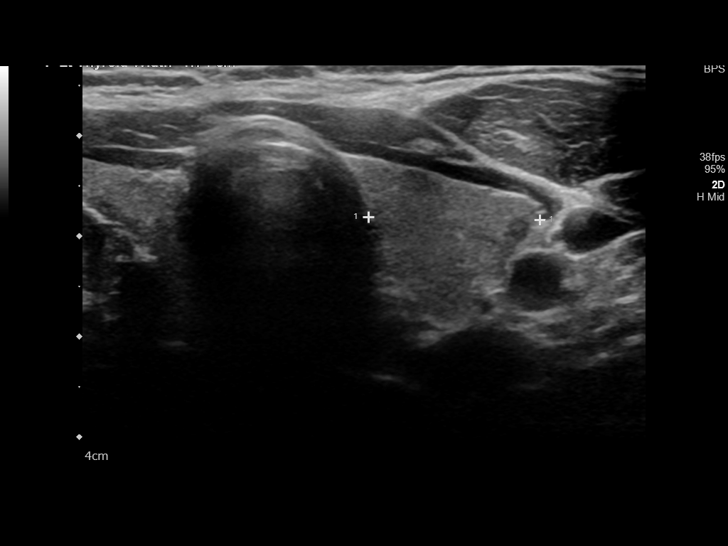
[im 46/79]
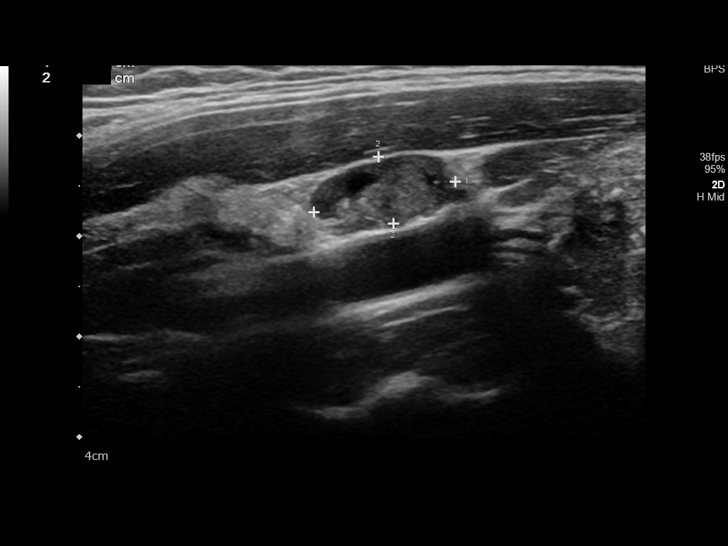
[im 53/79]
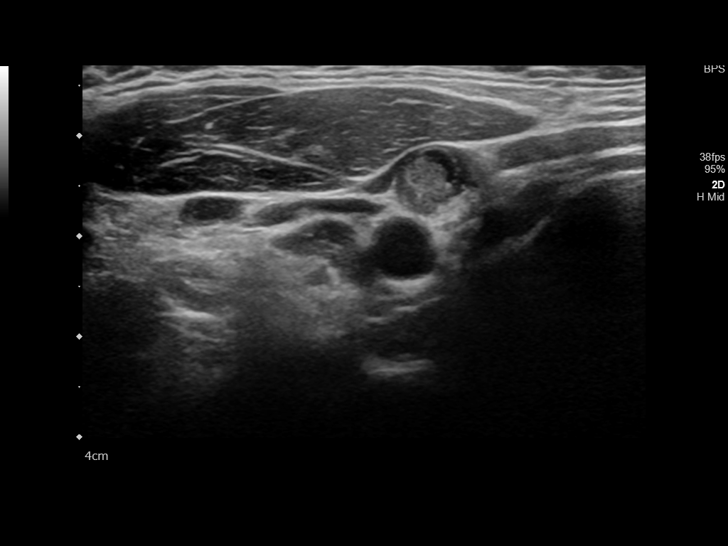
[im 59/79]
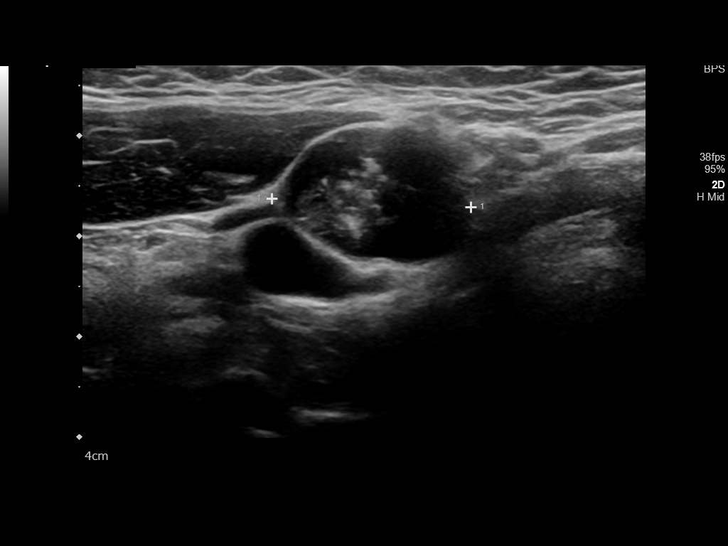
[im 66/79]
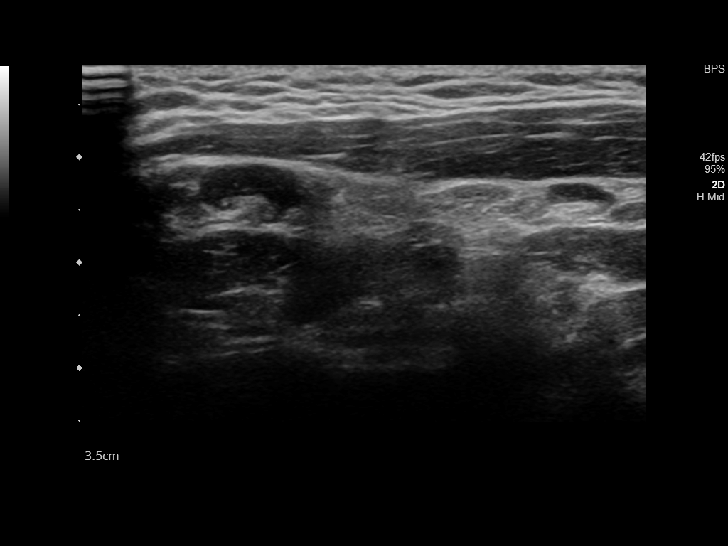
[im 72/79]
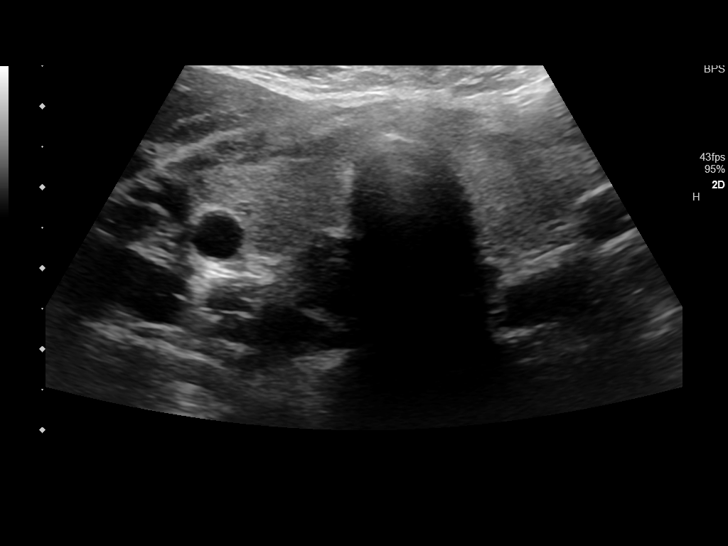
[im 79/79]
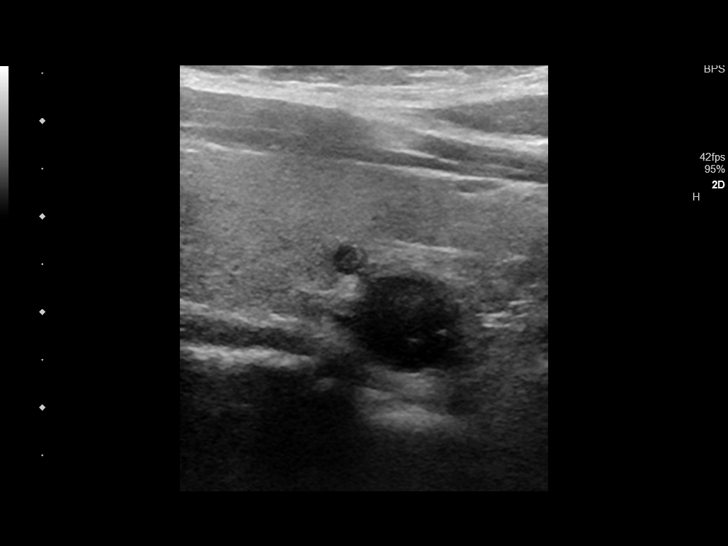

[13 of 25 positions shown; findings below may reference images not displayed]

FINDINGS: Parenchymal Echotexture: Normal

Isthmus: Normal in size measuring 0.2 cm in diameter

Right lobe: Normal in size measuring 4.3 x 2.3 x 2.0 cm

Left lobe: Normal in size measuring 5.1 x 1.6 x 1.7 cm

_________________________________________________________

Estimated total number of nodules >/= 1 cm: 1

Number of spongiform nodules >/=  2 cm not described below (TR1): 0

Number of mixed cystic and solid nodules >/= 1.5 cm not described
below (TR2): 0

_________________________________________________________

Nodule # 1:

Location: Right; Mid - this nodule correlates with the nodule
questioned on preceding neck CTA

Maximum size: 2.2 cm; Other 2 dimensions: 1.6 x 1.0 cm

Composition: solid/almost completely solid (2)

Echogenicity: hypoechoic (2)

Shape: taller-than-wide (3)

Margins: smooth (0)

Echogenic foci: peripheral calcifications (2)

Additional echogenic foci 1:  macrocalcifications (1)

ACR TI-RADS total points: 10.

ACR TI-RADS risk category: TR5 (>/= 7 points).

ACR TI-RADS recommendations:

**Given size (>/= 1.0 cm) and appearance, fine needle aspiration of
this highly suspicious nodule should be considered based on TI-RADS
criteria.

_________________________________________________________

Pathologically enlarged, malignant appearing right cervical lymph
nodes with index right cervical lymph node measuring 1.3 cm in
greatest short axis diameter and contains apparent
macrocalcifications (image 58), likely correlating with the abnormal
appearing cervical lymph node seen on preceding contrast-enhanced
neck CT, image 73, series 8.

There is an additional abnormal appearing lymph node also seen
within the caudal aspect of the midline of the neck which measures
approximately 1.1 cm in greatest short axis diameter (image 76).

Scattered left-sided cervical lymph nodes are prominent though
individually not enlarged by size criteria with index left-sided
cervical lymph node measuring 0.6 cm in greatest short axis diameter
and maintaining a benign fatty hilum (image 66).
IMPRESSION: 1. Solitary highly suspicious nodule within the right lobe of the
thyroid, correlating with the nodule seen on preceding neck CTA,
meets imaging criteria to recommend percutaneous sampling as
clinically indicated.
2. Malignant appearing right sided and midline cervical lymph nodes,
nonspecific though worrisome for metastatic disease. Note, dominant
right cervical lymph node could undergo ultrasound-guided
fine-needle aspiration/core needle biopsy at the time of recommended
thyroid nodule biopsy as clinically indicated.

The above is in keeping with the ACR TI-RADS recommendations - [HOSPITAL] 9865;[DATE].

## 2020-10-30 NOTE — Progress Notes (Signed)
Complete physical exam   [name]   DOB: 12-30-1985   34 y.o. Female  MRN: [id number] Visit Date: 10/31/2020  Today's healthcare provider: Trey Sailors, PA-C   Chief Complaint  Patient presents with  . Annual Exam   Subjective    Brittany Walter is a 34 y.o. female who presents today for a complete physical exam.  She reports consuming a general diet. The patient does not participate in regular exercise at present. She generally feels fairly well. She reports sleeping fairly well. She does not have additional problems to discuss today.    PCOS  Has irregular periods. Has been on metformin previously, though the IR caused some GI side effects. She was most recently on metformin XR 500 mg daily but found these pills hard to swallow. Interested in trying metformin IR again as she can cut or crush these. She reports recently gaining some of the weight back that she had previously lost.   Wt Readings from Last 3 Encounters:  10/31/20 180 lb (81.6 kg)  12/17/19 149 lb 11.2 oz (67.9 kg)  12/14/19 150 lb (68 kg)   She continues to follow with [facility] endocrinology for thyroid cancer s/p total thyroidectomy. She has had some post surgical hypoparathyroidism and is maintained on calcitriol.   Past Medical History:  Diagnosis Date  . Allergy    Seasonal  . Hypertension   . PCOS (polycystic ovarian syndrome)   . Supraventricular tachycardia [facility])    Past Surgical History:  Procedure Laterality Date  . CARDIAC ELECTROPHYSIOLOGY MAPPING AND ABLATION  2011   Social History   Socioeconomic History  . Marital status: Married    Spouse name: Not on file  . Number of children: Not on file  . Years of education: Not on file  . Highest education level: Not on file  Occupational History  . Not on file  Tobacco Use  . Smoking status: Former Smoker    Packs/day: 0.50    Years: 10.00    Pack years: 5.00    Types: Cigarettes    Quit date: 02/02/2020    Years since  quitting: 0.7  . Smokeless tobacco: Never Used  Vaping Use  . Vaping Use: Never used  Substance and Sexual Activity  . Alcohol use: No  . Drug use: No  . Sexual activity: Yes  Other Topics Concern  . Not on file  Social History Narrative  . Not on file   Social Determinants of Health   Financial Resource Strain: Not on file  Food Insecurity: Not on file  Transportation Needs: Not on file  Physical Activity: Not on file  Stress: Not on file  Social Connections: Not on file  Intimate Partner Violence: Not on file   No family status information on file.   Family History  Adopted: Yes  Family history unknown: Yes   No Known Allergies  Patient Care Team: Maryella Shivers as PCP - General (Physician Assistant)   Medications: Outpatient Medications Prior to Visit  Medication Sig  . calcitRIOL (ROCALTROL) 0.25 MCG capsule Take 2 capsules by mouth daily.  Marland Kitchen levothyroxine (SYNTHROID) 125 MCG tablet Take 1 tablet by mouth daily.  . SUMAtriptan (IMITREX) 50 MG tablet May repeat in 2 hours if headache persists or recurs.  . [DISCONTINUED] metFORMIN (GLUCOPHAGE-XR) 500 MG 24 hr tablet TAKE 1 TABLET(500 MG) BY MOUTH DAILY WITH BREAKFAST (Patient not taking: No sig reported)   No facility-administered medications prior to visit.  Review of Systems  Constitutional: Negative.   HENT: Negative.   Eyes: Negative.   Respiratory: Negative.   Cardiovascular: Negative.   Gastrointestinal: Negative.   Endocrine: Negative.   Genitourinary: Negative.   Musculoskeletal: Negative.   Skin: Negative.   Allergic/Immunologic: Negative.   Neurological: Negative.   Hematological: Negative.   Psychiatric/Behavioral: Negative.       Objective    BP 129/85   Pulse 82   Temp 98.3 F (36.8 C)   Resp 16   Ht 4\' 11"  (1.499 m)   Wt 180 lb (81.6 kg)   BMI 36.36 kg/m    Physical Exam Constitutional:      Appearance: Normal appearance.  HENT:     Right Ear: Tympanic membrane  and ear canal normal.     Left Ear: Tympanic membrane and ear canal normal.  Cardiovascular:     Rate and Rhythm: Normal rate and regular rhythm.     Heart sounds: Normal heart sounds.  Pulmonary:     Effort: Pulmonary effort is normal.     Breath sounds: Normal breath sounds.  Abdominal:     General: Bowel sounds are normal.     Palpations: Abdomen is soft.  Musculoskeletal:     Cervical back: Normal range of motion and neck supple.  Lymphadenopathy:     Cervical: No cervical adenopathy.  Skin:    General: Skin is warm and dry.  Neurological:     Mental Status: She is alert and oriented to person, place, and time. Mental status is at baseline.  Psychiatric:        Mood and Affect: Mood normal.        Behavior: Behavior normal.       Last depression screening scores PHQ 2/9 Scores 10/31/2020 06/17/2019 05/19/2018  PHQ - 2 Score 0 0 0  PHQ- 9 Score 0 0 -   Last fall risk screening Fall Risk  10/31/2020  Falls in the past year? 0  Number falls in past yr: 0  Injury with Fall? 0  Risk for fall due to : No Fall Risks  Follow up Falls evaluation completed   Last Audit-C alcohol use screening Alcohol Use Disorder Test (AUDIT) 10/31/2020  1. How often do you have a drink containing alcohol? 0  2. How many drinks containing alcohol do you have on a typical day when you are drinking? 0  3. How often do you have six or more drinks on one occasion? 0  AUDIT-C Score 0  Alcohol Brief Interventions/Follow-up AUDIT Score <7 follow-up not indicated   A score of 3 or more in women, and 4 or more in men indicates increased risk for alcohol abuse, EXCEPT if all of the points are from question 1   No results found for any visits on 10/31/20.  Assessment & Plan    Routine Health Maintenance and Physical Exam  Exercise Activities and Dietary recommendations Goals   None     Immunization History  Administered Date(s) Administered  . Influenza-Unspecified 08/17/2020  . PFIZER  SARS-COV-2 Vaccination 11/08/2019, 12/04/2019, 10/21/2020  . Tdap 06/17/2019    Health Maintenance  Topic Date Due  . COVID-19 Vaccine (4 - Booster for Pfizer series) 04/21/2021  . PAP SMEAR-Modifier  05/19/2021  . TETANUS/TDAP  06/16/2029  . INFLUENZA VACCINE  Completed  . Hepatitis C Screening  Completed  . HIV Screening  Completed    Discussed health benefits of physical activity, and encouraged her to engage in regular exercise appropriate for  her age and condition.  1. Annual physical exam  - TSH - Lipid panel - Comprehensive metabolic panel - CBC with Differential/Platelet  2. PSVT (paroxysmal supraventricular tachycardia) (HCC)   3. PCOS (polycystic ovarian syndrome)  Will change to metformin IR so she can cut the pills as she sees fit. Instructed how to titrate up to 2000 mg daily if she tolerates. Discussed possibility of spironolactone if metformin intolerable. F/u 1 year at [facility] or sooner if issues arise.   - metFORMIN (GLUCOPHAGE) 500 MG tablet; Take 1 tablet (500 mg total) by mouth daily.  Dispense: 180 tablet; Refill: 3  4. History of thyroid cancer  Followed by [facility] endocrinology.   5. S/P thyroidectomy .   Return in about 1 year (around 10/31/2021) for CPE .     ITrey Sailors, PA-C, have reviewed all documentation for this visit. The documentation on 10/31/20 for the exam, diagnosis, procedures, and orders are all accurate and complete.  The entirety of the information documented in the History of Present Illness, Review of Systems and Physical Exam were personally obtained by me. Portions of this information were initially documented by Anson [place], CMA and reviewed by me for thoroughness and accuracy.     Maryella Shivers  [facility] [phone] (phone) [phone] (fax)  [facility]

## 2020-10-31 ENCOUNTER — Ambulatory Visit (INDEPENDENT_AMBULATORY_CARE_PROVIDER_SITE_OTHER): Payer: 59 | Admitting: Physician Assistant

## 2020-10-31 ENCOUNTER — Encounter: Payer: Self-pay | Admitting: Physician Assistant

## 2020-10-31 ENCOUNTER — Other Ambulatory Visit: Payer: Self-pay

## 2020-10-31 VITALS — BP 129/85 | HR 82 | Temp 98.3°F | Resp 16 | Ht 59.0 in | Wt 180.0 lb

## 2020-10-31 DIAGNOSIS — I471 Supraventricular tachycardia, unspecified: Secondary | ICD-10-CM

## 2020-10-31 DIAGNOSIS — E890 Postprocedural hypothyroidism: Secondary | ICD-10-CM | POA: Diagnosis not present

## 2020-10-31 DIAGNOSIS — Z85850 Personal history of malignant neoplasm of thyroid: Secondary | ICD-10-CM | POA: Diagnosis not present

## 2020-10-31 DIAGNOSIS — E282 Polycystic ovarian syndrome: Secondary | ICD-10-CM | POA: Diagnosis not present

## 2020-10-31 DIAGNOSIS — Z9889 Other specified postprocedural states: Secondary | ICD-10-CM | POA: Insufficient documentation

## 2020-10-31 DIAGNOSIS — Z0000 Encounter for general adult medical examination without abnormal findings: Secondary | ICD-10-CM

## 2020-10-31 MED ORDER — METFORMIN HCL 500 MG PO TABS: 500.0000 mg | ORAL_TABLET | Freq: Every day | ORAL | 3 refills | Status: DC

## 2020-10-31 NOTE — Patient Instructions (Signed)
Health Maintenance, Female Adopting a healthy lifestyle and getting preventive care are important in promoting health and wellness. Ask your health care provider about:  The right schedule for you to have regular tests and exams.  Things you can do on your own to prevent diseases and keep yourself healthy. What should I know about diet, weight, and exercise? Eat a healthy diet   Eat a diet that includes plenty of vegetables, fruits, low-fat dairy products, and lean protein.  Do not eat a lot of foods that are high in solid fats, added sugars, or sodium. Maintain a healthy weight Body mass index (BMI) is used to identify weight problems. It estimates body fat based on height and weight. Your health care provider can help determine your BMI and help you achieve or maintain a healthy weight. Get regular exercise Get regular exercise. This is one of the most important things you can do for your health. Most adults should:  Exercise for at least 150 minutes each week. The exercise should increase your heart rate and make you sweat (moderate-intensity exercise).  Do strengthening exercises at least twice a week. This is in addition to the moderate-intensity exercise.  Spend less time sitting. Even light physical activity can be beneficial. Watch cholesterol and blood lipids Have your blood tested for lipids and cholesterol at 32 years of age, then have this test every 5 years. Have your cholesterol levels checked more often if:  Your lipid or cholesterol levels are high.  You are older than 32 years of age.  You are at high risk for heart disease. What should I know about cancer screening? Depending on your health history and family history, you may need to have cancer screening at various ages. This may include screening for:  Breast cancer.  Cervical cancer.  Colorectal cancer.  Skin cancer.  Lung cancer. What should I know about heart disease, diabetes, and high blood  pressure? Blood pressure and heart disease  High blood pressure causes heart disease and increases the risk of stroke. This is more likely to develop in people who have high blood pressure readings, are of African descent, or are overweight.  Have your blood pressure checked: ? Every 3-5 years if you are 18-39 years of age. ? Every year if you are 40 years old or older. Diabetes Have regular diabetes screenings. This checks your fasting blood sugar level. Have the screening done:  Once every three years after age 40 if you are at a normal weight and have a low risk for diabetes.  More often and at a younger age if you are overweight or have a high risk for diabetes. What should I know about preventing infection? Hepatitis B If you have a higher risk for hepatitis B, you should be screened for this virus. Talk with your health care provider to find out if you are at risk for hepatitis B infection. Hepatitis C Testing is recommended for:  Everyone born from 1945 through 1965.  Anyone with known risk factors for hepatitis C. Sexually transmitted infections (STIs)  Get screened for STIs, including gonorrhea and chlamydia, if: ? You are sexually active and are younger than 32 years of age. ? You are older than 32 years of age and your health care provider tells you that you are at risk for this type of infection. ? Your sexual activity has changed since you were last screened, and you are at increased risk for chlamydia or gonorrhea. Ask your health care provider if   you are at risk.  Ask your health care provider about whether you are at high risk for HIV. Your health care provider may recommend a prescription medicine to help prevent HIV infection. If you choose to take medicine to prevent HIV, you should first get tested for HIV. You should then be tested every 3 months for as long as you are taking the medicine. Pregnancy  If you are about to stop having your period (premenopausal) and  you may become pregnant, seek counseling before you get pregnant.  Take 400 to 800 micrograms (mcg) of folic acid every day if you become pregnant.  Ask for birth control (contraception) if you want to prevent pregnancy. Osteoporosis and menopause Osteoporosis is a disease in which the bones lose minerals and strength with aging. This can result in bone fractures. If you are 65 years old or older, or if you are at risk for osteoporosis and fractures, ask your health care provider if you should:  Be screened for bone loss.  Take a calcium or vitamin D supplement to lower your risk of fractures.  Be given hormone replacement therapy (HRT) to treat symptoms of menopause. Follow these instructions at home: Lifestyle  Do not use any products that contain nicotine or tobacco, such as cigarettes, e-cigarettes, and chewing tobacco. If you need help quitting, ask your health care provider.  Do not use street drugs.  Do not share needles.  Ask your health care provider for help if you need support or information about quitting drugs. Alcohol use  Do not drink alcohol if: ? Your health care provider tells you not to drink. ? You are pregnant, may be pregnant, or are planning to become pregnant.  If you drink alcohol: ? Limit how much you use to 0-1 drink a day. ? Limit intake if you are breastfeeding.  Be aware of how much alcohol is in your drink. In the [place] one drink equals one 12 oz bottle of beer (355 mL), one 5 oz glass of wine (148 mL), or one 1 oz glass of hard liquor (44 mL). General instructions  Schedule regular health, dental, and eye exams.  Stay current with your vaccines.  Tell your health care provider if: ? You often feel depressed. ? You have ever been abused or do not feel safe at home. Summary  Adopting a healthy lifestyle and getting preventive care are important in promoting health and wellness.  Follow your health care provider's instructions about healthy  diet, exercising, and getting tested or screened for diseases.  Follow your health care provider's instructions on monitoring your cholesterol and blood pressure. This information is not intended to replace advice given to you by your health care provider. Make sure you discuss any questions you have with your health care provider. Document Revised: 10/14/2018 Document Reviewed: 10/14/2018 Elsevier Patient Education  2020 [employer]  

## 2020-11-01 LAB — COMPREHENSIVE METABOLIC PANEL
ALT: 43 IU/L — ABNORMAL HIGH (ref 0–32)
AST: 44 IU/L — ABNORMAL HIGH (ref 0–40)
Albumin/Globulin Ratio: 1.5 (ref 1.2–2.2)
Albumin: 4.8 g/dL (ref 3.8–4.8)
Alkaline Phosphatase: 42 IU/L — ABNORMAL LOW (ref 44–121)
BUN/Creatinine Ratio: 16 (ref 9–23)
BUN: 11 mg/dL (ref 6–20)
Bilirubin Total: 0.3 mg/dL (ref 0.0–1.2)
CO2: 23 mmol/L (ref 20–29)
Calcium: 8.2 mg/dL — ABNORMAL LOW (ref 8.7–10.2)
Chloride: 100 mmol/L (ref 96–106)
Creatinine, Ser: 0.67 mg/dL (ref 0.57–1.00)
GFR calc Af Amer: 135 mL/min/{1.73_m2} (ref >59)
GFR calc non Af Amer: 117 mL/min/{1.73_m2} (ref >59)
Globulin, Total: 3.1 g/dL (ref 1.5–4.5)
Glucose: 91 mg/dL (ref 65–99)
Potassium: 4.2 mmol/L (ref 3.5–5.2)
Sodium: 139 mmol/L (ref 134–144)
Total Protein: 7.9 g/dL (ref 6.0–8.5)

## 2020-11-01 LAB — CBC WITH DIFFERENTIAL/PLATELET
Basophils Absolute: 0.1 10*3/uL (ref 0.0–0.2)
Basos: 1 %
EOS (ABSOLUTE): 0.3 10*3/uL (ref 0.0–0.4)
Eos: 3 %
Hematocrit: 41 % (ref 34.0–46.6)
Hemoglobin: 13.8 g/dL (ref 11.1–15.9)
Immature Grans (Abs): 0 10*3/uL (ref 0.0–0.1)
Immature Granulocytes: 0 %
Lymphocytes Absolute: 2.5 10*3/uL (ref 0.7–3.1)
Lymphs: 30 %
MCH: 29.3 pg (ref 26.6–33.0)
MCHC: 33.7 g/dL (ref 31.5–35.7)
MCV: 87 fL (ref 79–97)
Monocytes Absolute: 0.5 10*3/uL (ref 0.1–0.9)
Monocytes: 5 %
Neutrophils Absolute: 5.1 10*3/uL (ref 1.4–7.0)
Neutrophils: 61 %
Platelets: 328 10*3/uL (ref 150–450)
RBC: 4.71 x10E6/uL (ref 3.77–5.28)
RDW: 12.3 % (ref 11.7–15.4)
WBC: 8.5 10*3/uL (ref 3.4–10.8)

## 2020-11-01 LAB — LIPID PANEL
Chol/HDL Ratio: 3.6 ratio (ref 0.0–4.4)
Cholesterol, Total: 162 mg/dL (ref 100–199)
HDL: 45 mg/dL (ref >39)
LDL Chol Calc (NIH): 95 mg/dL (ref 0–99)
Triglycerides: 123 mg/dL (ref 0–149)
VLDL Cholesterol Cal: 22 mg/dL (ref 5–40)

## 2020-11-01 LAB — TSH: TSH: 0.57 u[IU]/mL (ref 0.450–4.500)

## 2021-01-07 ENCOUNTER — Encounter: Payer: Self-pay | Admitting: Physician Assistant

## 2021-01-07 DIAGNOSIS — G43809 Other migraine, not intractable, without status migrainosus: Secondary | ICD-10-CM

## 2021-01-08 MED ORDER — SUMATRIPTAN SUCCINATE 50 MG PO TABS: ORAL_TABLET | ORAL | 1 refills | Status: DC

## 2021-04-11 ENCOUNTER — Encounter: Payer: Self-pay | Admitting: Physician Assistant

## 2021-04-11 ENCOUNTER — Telehealth: Payer: Self-pay | Admitting: Physician Assistant

## 2021-04-11 ENCOUNTER — Telehealth: Payer: No Typology Code available for payment source | Admitting: Nurse Practitioner

## 2021-04-11 DIAGNOSIS — U071 COVID-19: Secondary | ICD-10-CM

## 2021-04-11 MED ORDER — PSEUDOEPH-BROMPHEN-DM 30-2-10 MG/5ML PO SYRP: 5.0000 mL | ORAL_SOLUTION | Freq: Four times a day (QID) | ORAL | 0 refills | Status: DC | PRN

## 2021-04-11 MED ORDER — MOLNUPIRAVIR EUA 200MG CAPSULE: 4.0000 | ORAL_CAPSULE | Freq: Two times a day (BID) | ORAL | 0 refills | Status: AC

## 2021-04-11 NOTE — Progress Notes (Signed)
You need to do a video visit in [place]reder to get  Treatment with one of the new antivirals. Cannot do in an evisit. You will not be charged for this visit.

## 2021-04-11 NOTE — Telephone Encounter (Signed)
Pt is calling and took rapid in home covid test today  that was positive and pt would like to have covid treatment. Pt will need virtual appt. Please advise

## 2021-04-11 NOTE — Telephone Encounter (Signed)
Advised patient that there is no available openings in office today or at [facility], suggested virtual appointment through [employer]. Patient understood and agreed. KW

## 2021-04-11 NOTE — Patient Instructions (Signed)
Hello Brittany Walter,  You are being placed in the home monitoring program for COVID-19 (commonly known as Coronavirus).  This is because you are suspected to have the virus or are known to have the virus.  If you are unsure which group you fall into call your clinic.    As part of this program, you'll answer a daily questionnaire in the MyChart mobile app. You'll receive a notification through the MyChart app when the questionnaire is available. When you log in to MyChart, you'll see the tasks in your To Do activity.       Clinicians will see any answers that are concerning and take appropriate steps.  If at any point you are having a medical emergency, call 911.  If otherwise concerned call your clinic instead of coming into the clinic or hospital.  To keep from spreading the disease you should: Stay home and limit contact with other people as much as possible.  Wash your hands frequently. Cover your coughs and sneezes with a tissue, and throw used tissues in the trash.   Clean and disinfect frequently touched surfaces and objects.    Take care of yourself by: Staying home Resting Drinking fluids Take fever-reducing medications (Tylenol/Acetaminophen and Ibuprofen)  For more information on the disease go to the Centers for Disease Control and Prevention website     You are being prescribed MOLNUPIRAVIR for COVID-19 infection.    Please pick up your prescription at: [facility] and [address]   Please call the pharmacy or go through the drive through vs going inside if you are picking up the mediation yourself to prevent further spread. If prescribed to a [facility] affiliated pharmacy, a pharmacist will bring the medication out to your car.   ADMINISTRATION INSTRUCTIONS: 1. Take with or without food. Swallow the tablets whole. Don't chew, crush, or break the medications because it might not work as well  2. For each dose of the medication, you should be taking FOUR  tablets at one time, TWICE a day   3. Finish your full five-day course of Molnupiravir even if you feel better before you're done. Stopping this medication too early can make it less effective to prevent severe illness related to COVID19.    4. Molnupiravir is prescribed for YOU ONLY. Don't share it with others, even if they have similar symptoms as you. This medication might not be right for everyone.   5. Make sure to take steps to protect yourself and others while you're taking this medication in order to get well soon and to prevent others from getting sick with COVID-19.   **If you are of childbearing potential (any gender) - it is advised to not get pregnant while taking this medication and recommended that condoms are used for female partners the next 3 months after taking the medication out of extreme caution    COMMON SIDE EFFECTS: 1. Diarrhea 2. Nausea  3. Dizziness    If your COVID-19 symptoms get worse, get medical help right away. Call 911 if you experience symptoms such as worsening cough, trouble breathing, chest pain that doesn't go away, confusion, a hard time staying awake, and pale or blue-colored skin. This medication won't prevent all COVID-19 cases from getting worse.    Can take to lessen severity: Vit C 500mg  twice daily Quercertin 250-500mg  twice daily Zinc 75-100mg  daily Melatonin 3-6 mg at bedtime Vit D3 1000-2000 IU daily Aspirin 81 mg daily with food Optional: Famotidine 20mg  daily Also can  add tylenol/ibuprofen as needed for fevers and body aches May add Mucinex or Mucinex DM as needed for cough/congestion  10 Things You Can Do to Manage Your COVID-19 Symptoms at Home If you have possible or confirmed COVID-19: 1. Stay home except to get medical care. 2. Monitor your symptoms carefully. If your symptoms get worse, call your healthcare provider immediately. 3. Get rest and stay hydrated. 4. If you have a medical appointment, call the healthcare  provider ahead of time and tell them that you have or may have COVID-19. 5. For medical emergencies, call 911 and notify the dispatch personnel that you have or may have COVID-19. 6. Cover your cough and sneezes with a tissue or use the inside of your elbow. 7. Wash your hands often with soap and water for at least 20 seconds or clean your hands with an alcohol-based hand sanitizer that contains at least 60% alcohol. 8. As much as possible, stay in a specific room and away from other people in your home. Also, you should use a separate bathroom, if available. If you need to be around other people in or outside of the home, wear a mask. 9. Avoid sharing personal items with other people in your household, like dishes, towels, and bedding. 10. Clean all surfaces that are touched often, like counters, tabletops, and doorknobs. Use household cleaning sprays or wipes according to the label instructions. [email or web address] 05/19/2020 This information is not intended to replace advice given to you by your health care provider. Make sure you discuss any questions you have with your health care provider. Document Revised: 09/04/2020 Document Reviewed: 09/04/2020 Elsevier Patient Education  2021 [employer].

## 2021-04-11 NOTE — Progress Notes (Signed)
[name], Brittany Walter are scheduled for a virtual visit with your provider today.    Just as we do with appointments in the office, we must obtain your consent to participate.  Your consent will be active for this visit and any virtual visit you may have with one of our providers in the next 365 days.    If you have a MyChart account, I can also send a copy of this consent to you electronically.  All virtual visits are billed to your insurance company just like a traditional visit in the office.  As this is a virtual visit, video technology does not allow for your provider to perform a traditional examination.  This may limit your provider's ability to fully assess your condition.  If your provider identifies any concerns that need to be evaluated in person or the need to arrange testing such as labs, EKG, etc, we will make arrangements to do so.    Although advances in technology are sophisticated, we cannot ensure that it will always work on either your end or our end.  If the connection with a video visit is poor, we may have to switch to a telephone visit.  With either a video or telephone visit, we are not always able to ensure that we have a secure connection.   I need to obtain your verbal consent now.   Are you willing to proceed with your visit today?   Tarri Guilfoil has provided verbal consent on 04/11/2021 for a virtual visit (video or telephone).   Margaretann Loveless, PA-C 04/11/2021  12:32 PM    MyChart Video Visit    Virtual Visit via Video Note   This visit type was conducted due to national recommendations for restrictions regarding the COVID-19 Pandemic (e.g. social distancing) in an effort to limit this patient's exposure and mitigate transmission in our community. This patient is at least at moderate risk for complications without adequate follow up. This format is felt to be most appropriate for this patient at this time. Physical exam was limited by quality of the video and audio  technology used for the visit.   Patient location: Home Provider location: Home office in [place]  I discussed the limitations of evaluation and management by telemedicine and the availability of in person appointments. The patient expressed understanding and agreed to proceed.  [name]   DOB: 06-30-1986   35 y.o. Female  MRN: [id number] Visit Date: 04/11/2021  Today's healthcare provider: Margaretann Loveless, PA-C   No chief complaint on file.  Subjective    URI  This is a new problem. The current episode started in the past 7 days (Sunday morning, 04/08/21). The problem has been gradually worsening. The maximum temperature recorded prior to her arrival was 100.4 - 100.9 F. The fever has been present for 3 to 4 days. Associated symptoms include congestion, coughing (mostly dry), headaches, rhinorrhea, sinus pain, a sore throat and vomiting (secondary to cough). Pertinent negatives include no abdominal pain, ear pain, nausea or wheezing. She has tried decongestant and acetaminophen (Robitussin) for the symptoms. The treatment provided mild relief.    Tested positive for Covid 19 today  Patient Active Problem List   Diagnosis Date Noted  . S/P thyroidectomy 10/31/2020  . PCOS (polycystic ovarian syndrome) 06/17/2019  . PSVT (paroxysmal supraventricular tachycardia) (HCC) 05/25/2019  . Palpitations 05/25/2019  . Hyperlipidemia, mixed 05/25/2019  . Benign essential HTN 05/25/2019  . Hypertension 08/24/2015  . Acute anxiety 08/24/2015  .  History of cardiac radiofrequency ablation 08/24/2015   Past Medical History:  Diagnosis Date  . Allergy    Seasonal  . Hypertension   . PCOS (polycystic ovarian syndrome)   . Supraventricular tachycardia (HCC)       Medications: Outpatient Medications Prior to Visit  Medication Sig  . levothyroxine (SYNTHROID) 125 MCG tablet Take 1 tablet by mouth daily.  . metFORMIN (GLUCOPHAGE) 500 MG tablet Take 1 tablet (500 mg total)  by mouth daily.  . SUMAtriptan (IMITREX) 50 MG tablet May repeat in 2 hours if headache persists or recurs.   No facility-administered medications prior to visit.    Review of Systems  Constitutional: Positive for chills and fever (100.4 intermittent).  HENT: Positive for congestion, mouth sores, postnasal drip, rhinorrhea, sinus pressure, sinus pain and sore throat. Negative for ear pain.   Respiratory: Positive for cough (mostly dry). Negative for chest tightness, shortness of breath and wheezing.   Cardiovascular: Negative.   Gastrointestinal: Positive for vomiting (secondary to cough). Negative for abdominal pain and nausea.  Neurological: Positive for headaches. Negative for dizziness.    Last CBC Lab Results  Component Value Date   WBC 8.5 10/31/2020   HGB 13.8 10/31/2020   HCT 41.0 10/31/2020   MCV 87 10/31/2020   MCH 29.3 10/31/2020   RDW 12.3 10/31/2020   PLT 328 10/31/2020   Last metabolic panel Lab Results  Component Value Date   GLUCOSE 91 10/31/2020   NA 139 10/31/2020   K 4.2 10/31/2020   CL 100 10/31/2020   CO2 23 10/31/2020   BUN 11 10/31/2020   CREATININE 0.67 10/31/2020   GFRNONAA 117 10/31/2020   GFRAA 135 10/31/2020   CALCIUM 8.2 (L) 10/31/2020   PROT 7.9 10/31/2020   ALBUMIN 4.8 10/31/2020   LABGLOB 3.1 10/31/2020   AGRATIO 1.5 10/31/2020   BILITOT 0.3 10/31/2020   ALKPHOS 42 (L) 10/31/2020   AST 44 (H) 10/31/2020   ALT 43 (H) 10/31/2020   ANIONGAP 10 12/14/2019      Objective    There were no vitals taken for this visit. BP Readings from Last 3 Encounters:  10/31/20 129/85  12/17/19 (!) 132/93  12/14/19 124/80   Wt Readings from Last 3 Encounters:  10/31/20 180 lb (81.6 kg)  12/17/19 149 lb 11.2 oz (67.9 kg)  12/14/19 150 lb (68 kg)      Physical Exam Vitals reviewed.  Constitutional:      General: She is not in acute distress.    Appearance: Normal appearance. She is well-developed. She is not ill-appearing.  HENT:      Head: Normocephalic and atraumatic.  Pulmonary:     Effort: Pulmonary effort is normal. No respiratory distress (no issues with speaking in full, coherent sentences).  Musculoskeletal:     Cervical back: Normal range of motion and neck supple.  Neurological:     Mental Status: She is alert.  Psychiatric:        Mood and Affect: Mood normal.        Behavior: Behavior normal.        Thought Content: Thought content normal.        Judgment: Judgment normal.        Assessment & Plan     1. COVID-19 - Continue OTC symptomatic management of choice - Will send OTC vitamins and supplement information through AVS - Bromfed-DM provided for cough and congestion - Molnupiravir prescribed - Patient enrolled in MyChart symptom monitoring - Push fluids - Rest  as needed - Discussed return precautions and when to seek in-person evaluation, sent via AVS as well  - molnupiravir EUA 200 mg CAPS; Take 4 capsules (800 mg total) by mouth 2 (two) times daily for 5 days.  Dispense: 40 capsule; Refill: 0 - brompheniramine-pseudoephedrine-DM 30-2-10 MG/5ML syrup; Take 5 mLs by mouth 4 (four) times daily as needed.  Dispense: 120 mL; Refill: 0 - MyChart COVID-19 home monitoring program; Future - Temperature monitoring; Future   No follow-ups on file.     I discussed the assessment and treatment plan with the patient. The patient was provided an opportunity to ask questions and all were answered. The patient agreed with the plan and demonstrated an understanding of the instructions.   The patient was advised to call back or seek an in-person evaluation if the symptoms worsen or if the condition fails to improve as anticipated.  I provided 16 minutes of face-to-face time during this encounter via MyChart Video enabled encounter.   Reine Just [facility] [phone] (phone) [phone] (fax)  [facility]

## 2021-04-12 ENCOUNTER — Encounter: Payer: Self-pay | Admitting: Physician Assistant

## 2021-11-01 ENCOUNTER — Encounter: Payer: Self-pay | Admitting: Physician Assistant

## 2021-11-20 ENCOUNTER — Telehealth: Payer: Self-pay

## 2021-11-20 NOTE — Telephone Encounter (Signed)
Copied from CRM [id number]. Topic: General - Inquiry >> Nov 20, 2021  4:19 PM Wyonia Hough E wrote: Reason for CRM: Pt was advised to call pcp office to see if she can get blood work done from her endocrinologist / please advise

## 2021-11-22 NOTE — Telephone Encounter (Signed)
We only draw labs in our office that we order. Sorry

## 2021-11-23 NOTE — Telephone Encounter (Signed)
Patient aware.

## 2022-01-22 NOTE — Progress Notes (Signed)
? ? ?I,Sulibeya S Dimas,acting as a scribe for Shirlee Latch, MD.,have documented all relevant documentation on the behalf of Shirlee Latch, MD,as directed by  Shirlee Latch, MD while in the presence of Shirlee Latch, MD. ? ? ?Complete physical exam ? ? ?[name]   DOB: 1986/03/10   36 y.o. Female  MRN: [id number] ?Visit Date: 01/24/2022 ? ?Today's healthcare provider: Shirlee Latch, MD  ? ?Chief Complaint  ?Patient presents with  ? Annual Exam  ? ?Subjective  ?  ?Brittany Walter is a 36 y.o. female who presents today for a complete physical exam.  ?She reports consuming a general diet. Home exercise routine includes walking 2 hrs per week. She generally feels well. She reports sleeping well. She does not have additional problems to discuss today.  ? ? ?HPI  ? ?05/19/18 Pap/HPV-negative ? ?Patient reports she was having some problems with metformin. She reports diarrhea and would like to change medications. Patient reports she stopped taking medication about a year ago. She was taking for PCOS.   ? ?Has to take 2 pills of sumatriptan for migraines. Migraines are infrequent ? ?Past Medical History:  ?Diagnosis Date  ? Allergy   ? Seasonal  ? Hypertension   ? PCOS (polycystic ovarian syndrome)   ? Supraventricular tachycardia (HCC)   ? ?Past Surgical History:  ?Procedure Laterality Date  ? CARDIAC ELECTROPHYSIOLOGY MAPPING AND ABLATION  2011  ? ?Social History  ? ?Socioeconomic History  ? Marital status: Married  ?  Spouse name: Not on file  ? Number of children: Not on file  ? Years of education: Not on file  ? Highest education level: Not on file  ?Occupational History  ? Not on file  ?Tobacco Use  ? Smoking status: Former  ?  Packs/day: 0.50  ?  Years: 10.00  ?  Pack years: 5.00  ?  Types: Cigarettes  ?  Quit date: 02/02/2020  ?  Years since quitting: 1.9  ? Smokeless tobacco: Never  ?Vaping Use  ? Vaping Use: Never used  ?Substance and Sexual Activity  ? Alcohol use: No  ? Drug use:  No  ? Sexual activity: Yes  ?Other Topics Concern  ? Not on file  ?Social History Narrative  ? Not on file  ? ?Social Determinants of Health  ? ?Financial Resource Strain: Not on file  ?Food Insecurity: Not on file  ?Transportation Needs: Not on file  ?Physical Activity: Not on file  ?Stress: Not on file  ?Social Connections: Not on file  ?Intimate Partner Violence: Not on file  ? ?No family status information on file.  ? ?Family History  ?Adopted: Yes  ?Family history unknown: Yes  ? ?No Known Allergies  ?Patient Care Team: ?Erasmo Downer, MD as PCP - General (Family Medicine)  ? ?Medications: ?Outpatient Medications Prior to Visit  ?Medication Sig  ? calcitRIOL (ROCALTROL) 0.25 MCG capsule Take by mouth.  ? [DISCONTINUED] SUMAtriptan (IMITREX) 50 MG tablet May repeat in 2 hours if headache persists or recurs.  ? levothyroxine (SYNTHROID) 125 MCG tablet Take 1 tablet by mouth daily.  ? [DISCONTINUED] brompheniramine-pseudoephedrine-DM 30-2-10 MG/5ML syrup Take 5 mLs by mouth 4 (four) times daily as needed. (Patient not taking: Reported on 01/24/2022)  ? [DISCONTINUED] metFORMIN (GLUCOPHAGE) 500 MG tablet Take 1 tablet (500 mg total) by mouth daily. (Patient not taking: Reported on 01/24/2022)  ? ?No facility-administered medications prior to visit.  ? ? ?Review of Systems  ?All other systems reviewed and are negative. ? ?  Last CBC ?Lab Results  ?Component Value Date  ? WBC 8.5 10/31/2020  ? HGB 13.8 10/31/2020  ? HCT 41.0 10/31/2020  ? MCV 87 10/31/2020  ? MCH 29.3 10/31/2020  ? RDW 12.3 10/31/2020  ? PLT 328 10/31/2020  ? ?Last metabolic panel ?Lab Results  ?Component Value Date  ? GLUCOSE 91 10/31/2020  ? NA 139 10/31/2020  ? K 4.2 10/31/2020  ? CL 100 10/31/2020  ? CO2 23 10/31/2020  ? BUN 11 10/31/2020  ? CREATININE 0.67 10/31/2020  ? GFRNONAA 117 10/31/2020  ? CALCIUM 8.2 (L) 10/31/2020  ? PROT 7.9 10/31/2020  ? ALBUMIN 4.8 10/31/2020  ? LABGLOB 3.1 10/31/2020  ? AGRATIO 1.5 10/31/2020  ? BILITOT 0.3  10/31/2020  ? ALKPHOS 42 (L) 10/31/2020  ? AST 44 (H) 10/31/2020  ? ALT 43 (H) 10/31/2020  ? ANIONGAP 10 12/14/2019  ? ?Last lipids ?Lab Results  ?Component Value Date  ? CHOL 162 10/31/2020  ? HDL 45 10/31/2020  ? LDLCALC 95 10/31/2020  ? TRIG 123 10/31/2020  ? CHOLHDL 3.6 10/31/2020  ? ? ?Last thyroid functions ?Lab Results  ?Component Value Date  ? TSH 0.570 10/31/2020  ? T4TOTAL 10.3 12/17/2019  ? ?  ? Objective  ?  ?BP 121/86 (BP Location: Left Arm, Patient Position: Sitting, Cuff Size: Large)   Pulse 94   Temp 98.1 ?F (36.7 ?C) (Temporal)   Resp 16   Ht 4\' 11"  (1.499 m)   Wt 184 lb 6.4 oz (83.6 kg)   BMI 37.24 kg/m?  ?BP Readings from Last 3 Encounters:  ?01/24/22 121/86  ?10/31/20 129/85  ?12/17/19 (!) 132/93  ? ?Wt Readings from Last 3 Encounters:  ?01/24/22 184 lb 6.4 oz (83.6 kg)  ?10/31/20 180 lb (81.6 kg)  ?12/17/19 149 lb 11.2 oz (67.9 kg)  ? ?  ? ? ?Physical Exam ?Vitals reviewed.  ?Constitutional:   ?   General: She is not in acute distress. ?   Appearance: Normal appearance. She is well-developed. She is not diaphoretic.  ?HENT:  ?   Head: Normocephalic and atraumatic.  ?   Right Ear: Tympanic membrane, ear canal and external ear normal.  ?   Left Ear: Tympanic membrane, ear canal and external ear normal.  ?   Nose: Nose normal.  ?   Mouth/Throat:  ?   Mouth: Mucous membranes are moist.  ?   Pharynx: Oropharynx is clear. No oropharyngeal exudate.  ?Eyes:  ?   General: No scleral icterus. ?   Conjunctiva/sclera: Conjunctivae normal.  ?   Pupils: Pupils are equal, round, and reactive to light.  ?Neck:  ?   Thyroid: No thyromegaly.  ?Cardiovascular:  ?   Rate and Rhythm: Normal rate and regular rhythm.  ?   Pulses: Normal pulses.  ?   Heart sounds: Normal heart sounds. No murmur heard. ?Pulmonary:  ?   Effort: Pulmonary effort is normal. No respiratory distress.  ?   Breath sounds: Normal breath sounds. No wheezing or rales.  ?Abdominal:  ?   General: There is no distension.  ?   Palpations:  Abdomen is soft.  ?   Tenderness: There is no abdominal tenderness.  ?Genitourinary: ?   Comments: GYN:  External genitalia within normal limits.  Vaginal mucosa pink, moist, normal rugae.  Nonfriable cervix without lesions, no discharge or bleeding noted on speculum exam.   ?Musculoskeletal:     ?   General: No deformity.  ?   Cervical back: Neck supple.  ?  Right lower leg: No edema.  ?   Left lower leg: No edema.  ?Lymphadenopathy:  ?   Cervical: No cervical adenopathy.  ?Skin: ?   General: Skin is warm and dry.  ?   Findings: No rash.  ?Neurological:  ?   Mental Status: She is alert and oriented to person, place, and time. Mental status is at baseline.  ?   Gait: Gait normal.  ?Psychiatric:     ?   Mood and Affect: Mood normal.     ?   Behavior: Behavior normal.     ?   Thought Content: Thought content normal.  ?  ? ? ?Last depression screening scores ? ?  01/24/2022  ? 10:44 AM 10/31/2020  ?  9:32 AM 06/17/2019  ?  9:33 AM  ?PHQ 2/9 Scores  ?PHQ - 2 Score 0 0 0  ?PHQ- 9 Score 0 0 0  ? ?Last fall risk screening ? ?  01/24/2022  ? 10:44 AM  ?Fall Risk   ?Falls in the past year? 0  ?Number falls in past yr: 0  ?Injury with Fall? 0  ?Risk for fall due to : No Fall Risks  ?Follow up Falls evaluation completed  ? ?Last Audit-C alcohol use screening ? ?  01/24/2022  ? 10:44 AM  ?Alcohol Use Disorder Test (AUDIT)  ?1. How often do you have a drink containing alcohol? 0  ?2. How many drinks containing alcohol do you have on a typical day when you are drinking? 0  ?3. How often do you have six or more drinks on one occasion? 0  ?AUDIT-C Score 0  ? ?A score of 3 or more in women, and 4 or more in men indicates increased risk for alcohol abuse, EXCEPT if all of the points are from question 1  ? ?No results found for any visits on 01/24/22. ? Assessment & Plan  ?  ?Routine Health Maintenance and Physical Exam ? ?Exercise Activities and Dietary recommendations ? Goals   ?None ?  ? ? ?Immunization History  ?Administered Date(s)  Administered  ? Influenza-Unspecified 08/17/2020, 08/15/2021  ? PFIZER(Purple Top)SARS-COV-2 Vaccination 11/08/2019, 12/04/2019, 10/21/2020  ? Tdap 06/17/2019  ? ? ?Health Maintenance  ?Topic Date Due  ? COVID-19 Vac

## 2022-01-24 ENCOUNTER — Other Ambulatory Visit (HOSPITAL_COMMUNITY)
Admission: RE | Admit: 2022-01-24 | Discharge: 2022-01-24 | Disposition: A | Discharge status: Home / Self Care | Payer: 59 | Source: Ambulatory Visit | Attending: Family Medicine | Admitting: Family Medicine

## 2022-01-24 ENCOUNTER — Ambulatory Visit (INDEPENDENT_AMBULATORY_CARE_PROVIDER_SITE_OTHER): Payer: 59 | Admitting: Family Medicine

## 2022-01-24 ENCOUNTER — Encounter: Payer: Self-pay | Admitting: Family Medicine

## 2022-01-24 ENCOUNTER — Other Ambulatory Visit: Payer: Self-pay

## 2022-01-24 VITALS — BP 121/86 | HR 94 | Temp 98.1°F | Resp 16 | Ht 59.0 in | Wt 184.4 lb

## 2022-01-24 DIAGNOSIS — E890 Postprocedural hypothyroidism: Secondary | ICD-10-CM | POA: Diagnosis not present

## 2022-01-24 DIAGNOSIS — Z0000 Encounter for general adult medical examination without abnormal findings: Secondary | ICD-10-CM | POA: Diagnosis not present

## 2022-01-24 DIAGNOSIS — E282 Polycystic ovarian syndrome: Secondary | ICD-10-CM | POA: Diagnosis not present

## 2022-01-24 DIAGNOSIS — Z124 Encounter for screening for malignant neoplasm of cervix: Secondary | ICD-10-CM | POA: Diagnosis not present

## 2022-01-24 DIAGNOSIS — E782 Mixed hyperlipidemia: Secondary | ICD-10-CM | POA: Diagnosis not present

## 2022-01-24 DIAGNOSIS — I10 Essential (primary) hypertension: Secondary | ICD-10-CM

## 2022-01-24 DIAGNOSIS — Z85850 Personal history of malignant neoplasm of thyroid: Secondary | ICD-10-CM

## 2022-01-24 DIAGNOSIS — E039 Hypothyroidism, unspecified: Secondary | ICD-10-CM | POA: Insufficient documentation

## 2022-01-24 DIAGNOSIS — G43809 Other migraine, not intractable, without status migrainosus: Secondary | ICD-10-CM

## 2022-01-24 DIAGNOSIS — G43709 Chronic migraine without aura, not intractable, without status migrainosus: Secondary | ICD-10-CM

## 2022-01-24 DIAGNOSIS — I471 Supraventricular tachycardia: Secondary | ICD-10-CM

## 2022-01-24 MED ORDER — SUMATRIPTAN SUCCINATE 100 MG PO TABS
ORAL_TABLET | ORAL | 5 refills | Status: DC
Start: 2022-01-24 — End: 2024-02-12

## 2022-01-24 MED ORDER — METFORMIN HCL ER 500 MG PO TB24: 500.0000 mg | ORAL_TABLET | Freq: Every day | ORAL | 3 refills | Status: DC

## 2022-01-24 NOTE — Assessment & Plan Note (Signed)
F/b endocrinology ?No changes to ?Continue levothyroxine ?Recheck TSH ?

## 2022-01-24 NOTE — Assessment & Plan Note (Signed)
Chronic and stable ?Increase Sumatriptan to 100mg  prn given that she is always needing 2 doses of the 50mg  tabs ?

## 2022-01-24 NOTE — Assessment & Plan Note (Signed)
Well controlled ?Continue diet control ?Recheck metabolic panel ?

## 2022-01-24 NOTE — Assessment & Plan Note (Signed)
S/p remote ablation ?Well controlled ?

## 2022-01-24 NOTE — Assessment & Plan Note (Signed)
F/b endocrinology ?Has had some tingling in hands related to PTH ?Will recheck PTH today ?

## 2022-01-24 NOTE — Assessment & Plan Note (Signed)
Reviewed last lipid panel Not currently on a statin Recheck FLP and CMP Discussed diet and exercise  

## 2022-01-24 NOTE — Assessment & Plan Note (Signed)
Chronic and stable ?Switch metformin to XR formulation due to GI side effects ?

## 2022-01-25 ENCOUNTER — Telehealth: Payer: Self-pay

## 2022-01-25 DIAGNOSIS — R7989 Other specified abnormal findings of blood chemistry: Secondary | ICD-10-CM

## 2022-01-25 LAB — CBC
Hematocrit: 42.1 % (ref 34.0–46.6)
Hemoglobin: 13.9 g/dL (ref 11.1–15.9)
MCH: 29 pg (ref 26.6–33.0)
MCHC: 33 g/dL (ref 31.5–35.7)
MCV: 88 fL (ref 79–97)
Platelets: 307 10*3/uL (ref 150–450)
RBC: 4.8 x10E6/uL (ref 3.77–5.28)
RDW: 12.6 % (ref 11.7–15.4)
WBC: 8.8 10*3/uL (ref 3.4–10.8)

## 2022-01-25 LAB — COMPREHENSIVE METABOLIC PANEL
ALT: 61 IU/L — ABNORMAL HIGH (ref 0–32)
AST: 48 IU/L — ABNORMAL HIGH (ref 0–40)
Albumin/Globulin Ratio: 1.5 (ref 1.2–2.2)
Albumin: 4.8 g/dL (ref 3.8–4.8)
Alkaline Phosphatase: 43 IU/L — ABNORMAL LOW (ref 44–121)
BUN/Creatinine Ratio: 18 (ref 9–23)
BUN: 13 mg/dL (ref 6–20)
Bilirubin Total: 0.3 mg/dL (ref 0.0–1.2)
CO2: 25 mmol/L (ref 20–29)
Calcium: 8.3 mg/dL — ABNORMAL LOW (ref 8.7–10.2)
Chloride: 103 mmol/L (ref 96–106)
Creatinine, Ser: 0.71 mg/dL (ref 0.57–1.00)
Globulin, Total: 3.2 g/dL (ref 1.5–4.5)
Glucose: 94 mg/dL (ref 70–99)
Potassium: 3.9 mmol/L (ref 3.5–5.2)
Sodium: 142 mmol/L (ref 134–144)
Total Protein: 8 g/dL (ref 6.0–8.5)
eGFR: 115 mL/min/{1.73_m2} (ref >59)

## 2022-01-25 LAB — PARATHYROID HORMONE, INTACT (NO CA): PTH: 11 pg/mL — ABNORMAL LOW (ref 15–65)

## 2022-01-25 LAB — LIPID PANEL WITH LDL/HDL RATIO
Cholesterol, Total: 151 mg/dL (ref 100–199)
HDL: 45 mg/dL (ref >39)
LDL Chol Calc (NIH): 78 mg/dL (ref 0–99)
LDL/HDL Ratio: 1.7 ratio (ref 0.0–3.2)
Triglycerides: 164 mg/dL — ABNORMAL HIGH (ref 0–149)
VLDL Cholesterol Cal: 28 mg/dL (ref 5–40)

## 2022-01-25 LAB — TSH: TSH: 0.472 u[IU]/mL (ref 0.450–4.500)

## 2022-01-25 NOTE — Telephone Encounter (Signed)
-----   Message from Virginia Crews, MD sent at 01/25/2022  8:08 AM EDT ----- ?Normal/stable labs, except for elevated liver function tests.  Recommend cutting back on alcohol and tylenol, and hydrating well.  Recheck LFTs in 1 month. ?

## 2022-01-25 NOTE — Telephone Encounter (Signed)
Seen by patient Brittany Walter on 01/25/2022  8:39 AM. ?Labs ordered ?

## 2022-01-29 LAB — CYTOLOGY - PAP
Chlamydia: NEGATIVE
Comment: NEGATIVE
Comment: NEGATIVE
Comment: NEGATIVE
Comment: NEGATIVE
Comment: NEGATIVE
Comment: NORMAL
Diagnosis: NEGATIVE
HPV 16: NEGATIVE
HPV 18 / 45: NEGATIVE
High risk HPV: POSITIVE — AB
Neisseria Gonorrhea: NEGATIVE
Trichomonas: NEGATIVE

## 2022-02-02 ENCOUNTER — Encounter: Payer: Self-pay | Admitting: Family Medicine

## 2022-06-05 ENCOUNTER — Other Ambulatory Visit: Payer: Self-pay | Admitting: Family Medicine

## 2022-06-06 NOTE — Telephone Encounter (Signed)
Requested Prescriptions  Pending Prescriptions Disp Refills  . metFORMIN (GLUCOPHAGE-XR) 500 MG 24 hr tablet [Pharmacy Med Name: METFORMIN ER 500MG 24HR TABS] 90 tablet 2    Sig: TAKE 1 TABLET(500 MG) BY MOUTH DAILY WITH BREAKFAST     Endocrinology:  Diabetes - Biguanides Failed - 06/05/2022  3:55 AM      Failed - HBA1C is between 0 and 7.9 and within 180 days    No results found for: "HGBA1C", "LABA1C"       Failed - B12 Level in normal range and within 720 days    No results found for: "VITAMINB12"       Failed - CBC within normal limits and completed in the last 12 months    WBC  Date Value Ref Range Status  01/24/2022 8.8 3.4 - 10.8 x10E3/uL Final  12/14/2019 10.2 4.0 - 10.5 K/uL Final   RBC  Date Value Ref Range Status  01/24/2022 4.80 3.77 - 5.28 x10E6/uL Final  12/14/2019 4.55 3.87 - 5.11 MIL/uL Final   Hemoglobin  Date Value Ref Range Status  01/24/2022 13.9 11.1 - 15.9 g/dL Final   Hematocrit  Date Value Ref Range Status  01/24/2022 42.1 34.0 - 46.6 % Final   MCHC  Date Value Ref Range Status  01/24/2022 33.0 31.5 - 35.7 g/dL Final  12/14/2019 33.4 30.0 - 36.0 g/dL Final   [facility]  Date Value Ref Range Status  01/24/2022 29.0 26.6 - 33.0 pg Final  12/14/2019 30.8 26.0 - 34.0 pg Final   MCV  Date Value Ref Range Status  01/24/2022 88 79 - 97 fL Final   No results found for: "PLTCOUNTKUC", "LABPLAT", "POCPLA" RDW  Date Value Ref Range Status  01/24/2022 12.6 11.7 - 15.4 % Final         Passed - Cr in normal range and within 360 days    Creatinine, Ser  Date Value Ref Range Status  01/24/2022 0.71 0.57 - 1.00 mg/dL Final         Passed - eGFR in normal range and within 360 days    GFR calc Af Amer  Date Value Ref Range Status  10/31/2020 135 >59 mL/min/1.73 Final    Comment:    **In accordance with recommendations from the NKF-ASN Task force,**   [facility] is in the process of updating its eGFR calculation to the   2021 CKD-EPI creatinine equation that  estimates kidney function   without a race variable.    GFR calc non Af Amer  Date Value Ref Range Status  10/31/2020 117 >59 mL/min/1.73 Final   eGFR  Date Value Ref Range Status  01/24/2022 115 >59 mL/min/1.73 Final         Passed - Valid encounter within last 6 months    Recent Outpatient Visits          4 months ago Annual physical exam   [facility] [place], Dionne Bucy, MD   1 year ago Annual physical exam   [facility] Carles Collet M, [place]   2 years ago Other migraine without status migrainosus, not intractable   [facility] [place], Wendee Beavers, [place]   2 years ago Annual physical exam   [facility] Carles Collet M, [place]   3 years ago Hypertensive urgency   [facility], Clearnce Sorrel, [place]      Future Appointments            In 7 months Bacigalupo, Dionne Bucy, MD [employer]

## 2022-08-16 ENCOUNTER — Encounter: Payer: Self-pay | Admitting: Family Medicine

## 2022-08-16 DIAGNOSIS — E039 Hypothyroidism, unspecified: Secondary | ICD-10-CM

## 2022-08-19 NOTE — Telephone Encounter (Signed)
Please place endocrinology referral to [facility]  (preference [facility]) for hypothyroidism and let patient know when it is done and that they will call her to set up appt.

## 2022-11-07 DIAGNOSIS — E282 Polycystic ovarian syndrome: Secondary | ICD-10-CM | POA: Diagnosis not present

## 2022-11-07 DIAGNOSIS — E892 Postprocedural hypoparathyroidism: Secondary | ICD-10-CM | POA: Diagnosis not present

## 2022-11-07 DIAGNOSIS — E890 Postprocedural hypothyroidism: Secondary | ICD-10-CM | POA: Diagnosis not present

## 2022-11-07 DIAGNOSIS — C73 Malignant neoplasm of thyroid gland: Secondary | ICD-10-CM | POA: Diagnosis not present

## 2022-12-17 DIAGNOSIS — C73 Malignant neoplasm of thyroid gland: Secondary | ICD-10-CM | POA: Diagnosis not present

## 2023-01-27 ENCOUNTER — Encounter: Payer: 59 | Admitting: Family Medicine

## 2023-02-11 ENCOUNTER — Encounter: Payer: Self-pay | Admitting: Family Medicine

## 2023-02-11 ENCOUNTER — Ambulatory Visit (INDEPENDENT_AMBULATORY_CARE_PROVIDER_SITE_OTHER): Payer: BC Managed Care – PPO | Admitting: Family Medicine

## 2023-02-11 ENCOUNTER — Other Ambulatory Visit (HOSPITAL_COMMUNITY)
Admission: RE | Admit: 2023-02-11 | Discharge: 2023-02-11 | Disposition: A | Discharge status: Home / Self Care | Payer: BC Managed Care – PPO | Source: Ambulatory Visit | Attending: Family Medicine | Admitting: Family Medicine

## 2023-02-11 VITALS — BP 109/76 | HR 77 | Temp 98.1°F | Resp 12 | Ht 59.0 in | Wt 148.1 lb

## 2023-02-11 DIAGNOSIS — R8789 Other abnormal findings in specimens from female genital organs: Secondary | ICD-10-CM | POA: Diagnosis not present

## 2023-02-11 DIAGNOSIS — Z0000 Encounter for general adult medical examination without abnormal findings: Secondary | ICD-10-CM | POA: Diagnosis not present

## 2023-02-11 DIAGNOSIS — E782 Mixed hyperlipidemia: Secondary | ICD-10-CM

## 2023-02-11 DIAGNOSIS — Z124 Encounter for screening for malignant neoplasm of cervix: Secondary | ICD-10-CM

## 2023-02-11 DIAGNOSIS — E890 Postprocedural hypothyroidism: Secondary | ICD-10-CM

## 2023-02-11 DIAGNOSIS — E559 Vitamin D deficiency, unspecified: Secondary | ICD-10-CM | POA: Diagnosis not present

## 2023-02-11 NOTE — Assessment & Plan Note (Signed)
No atypical cells on pap last year, but was positive for HR HPV Repeat Pap with HPV co-testing today Next steps pending results

## 2023-02-11 NOTE — Assessment & Plan Note (Signed)
Continue supplement Recheck level 

## 2023-02-11 NOTE — Assessment & Plan Note (Signed)
F/b endocrinology Reviewed last labs No changes today

## 2023-02-11 NOTE — Progress Notes (Signed)
I,Sulibeya S Dimas,acting as a Neurosurgeonscribe for Shirlee LatchAngela Khamari Sheehan, MD.,have documented all relevant documentation on the behalf of Shirlee LatchAngela Remy Voiles, MD,as directed by  Shirlee LatchAngela Silas Sedam, MD while in the presence of Shirlee LatchAngela Ricardo Kayes, MD.    Complete physical exam   [name]   DOB: 01/13/1986   37 y.o. Female  MRN: [id number] Visit Date: 02/11/2023  Today's healthcare provider: Shirlee LatchAngela Lamine Laton, MD   Chief Complaint  Patient presents with   Annual Exam   Subjective    Brittany Walter is a 37 y.o. female who presents today for a complete physical exam.  She reports consuming a general diet. Home exercise routine includes cardio and weights. She generally feels well. She reports sleeping well. She does not have additional problems to discuss today.  HPI   On phentermine per Endo and using weight watchers.  Taking OTC Vit D since low Vit D on Endo labs  Past Medical History:  Diagnosis Date   Allergy    Seasonal   Hypertension    PCOS (polycystic ovarian syndrome)    Supraventricular tachycardia    Past Surgical History:  Procedure Laterality Date   CARDIAC ELECTROPHYSIOLOGY MAPPING AND ABLATION  2011   Social History   Socioeconomic History   Marital status: Married    Spouse name: Not on file   Number of children: Not on file   Years of education: Not on file   Highest education level: Not on file  Occupational History   Not on file  Tobacco Use   Smoking status: Former    Packs/day: 0.50    Years: 10.00    Additional pack years: 0.00    Total pack years: 5.00    Types: Cigarettes    Quit date: 02/02/2020    Years since quitting: 3.0   Smokeless tobacco: Never  Vaping Use   Vaping Use: Never used  Substance and Sexual Activity   Alcohol use: No   Drug use: No   Sexual activity: Yes  Other Topics Concern   Not on file  Social History Narrative   Not on file   Social Determinants of Health   Financial Resource Strain: Not on file  Food  Insecurity: Not on file  Transportation Needs: Not on file  Physical Activity: Not on file  Stress: Not on file  Social Connections: Not on file  Intimate Partner Violence: Not on file   No family status information on file.   Family History  Adopted: Yes  Family history unknown: Yes   No Known Allergies  Patient Care Team: Erasmo DownerBacigalupo, Mikhala Kenan M, MD as PCP - General (Family Medicine)   Medications: Outpatient Medications Prior to Visit  Medication Sig   calcitRIOL (ROCALTROL) 0.25 MCG capsule Take by mouth.   cholecalciferol (VITAMIN D3) 25 MCG (1000 UNIT) tablet Take 2,000 Units by mouth daily.   levothyroxine (SYNTHROID) 125 MCG tablet Take 1 tablet by mouth daily.   phentermine (ADIPEX-P) 37.5 MG tablet Take 37.5 mg by mouth daily.   SUMAtriptan (IMITREX) 100 MG tablet Take 1 tab PO as needed at first sign of migraine. May repeat in 2 hours if headache persists or recurs.   [DISCONTINUED] metFORMIN (GLUCOPHAGE-XR) 500 MG 24 hr tablet TAKE 1 TABLET(500 MG) BY MOUTH DAILY WITH BREAKFAST   No facility-administered medications prior to visit.    Review of Systems  Constitutional: Negative.   HENT: Negative.    Eyes: Negative.   Respiratory: Negative.    Cardiovascular: Negative.   Gastrointestinal: Negative.  Endocrine: Negative.   Genitourinary: Negative.   Musculoskeletal: Negative.   Skin: Negative.   Allergic/Immunologic: Negative.   Neurological: Negative.   Hematological: Negative.   Psychiatric/Behavioral: Negative.        Objective    BP 109/76 (BP Location: Left Arm, Patient Position: Sitting, Cuff Size: Large)   Pulse 77   Temp 98.1 F (36.7 C) (Temporal)   Resp 12   Ht 4\' 11"  (1.499 m)   Wt 148 lb 1.6 oz (67.2 kg)   LMP 01/08/2023 (Exact Date)   BMI 29.91 kg/m     Physical Exam Vitals reviewed.  Constitutional:      General: She is not in acute distress.    Appearance: Normal appearance. She is well-developed. She is not diaphoretic.   HENT:     Head: Normocephalic and atraumatic.     Right Ear: Tympanic membrane, ear canal and external ear normal.     Left Ear: Tympanic membrane, ear canal and external ear normal.     Nose: Nose normal.     Mouth/Throat:     Mouth: Mucous membranes are moist.     Pharynx: Oropharynx is clear. No oropharyngeal exudate.  Eyes:     General: No scleral icterus.    Conjunctiva/sclera: Conjunctivae normal.     Pupils: Pupils are equal, round, and reactive to light.  Neck:     Thyroid: No thyromegaly.  Cardiovascular:     Rate and Rhythm: Normal rate and regular rhythm.     Pulses: Normal pulses.     Heart sounds: Normal heart sounds. No murmur heard. Pulmonary:     Effort: Pulmonary effort is normal. No respiratory distress.     Breath sounds: Normal breath sounds. No wheezing or rales.  Abdominal:     General: There is no distension.     Palpations: Abdomen is soft.     Tenderness: There is no abdominal tenderness.  Genitourinary:    Comments: GYN:  External genitalia within normal limits.  Vaginal mucosa pink, moist, normal rugae.  +Friable cervix without lesions, no discharge or bleeding noted on speculum exam.   Musculoskeletal:        General: No deformity.     Cervical back: Neck supple.     Right lower leg: No edema.     Left lower leg: No edema.  Lymphadenopathy:     Cervical: No cervical adenopathy.  Skin:    General: Skin is warm and dry.     Findings: No rash.  Neurological:     Mental Status: She is alert and oriented to person, place, and time. Mental status is at baseline.     Gait: Gait normal.  Psychiatric:        Mood and Affect: Mood normal.        Behavior: Behavior normal.        Thought Content: Thought content normal.       Last depression screening scores    02/11/2023   10:15 AM 01/24/2022   10:44 AM 10/31/2020    9:32 AM  PHQ 2/9 Scores  PHQ - 2 Score 0 0 0  PHQ- 9 Score 1 0 0   Last fall risk screening    02/11/2023   10:15 AM  Fall  Risk   Falls in the past year? 0  Number falls in past yr: 0  Injury with Fall? 0  Risk for fall due to : No Fall Risks  Follow up Falls evaluation completed   Last Audit-C  alcohol use screening    02/11/2023   10:15 AM  Alcohol Use Disorder Test (AUDIT)  1. How often do you have a drink containing alcohol? 0  2. How many drinks containing alcohol do you have on a typical day when you are drinking? 0  3. How often do you have six or more drinks on one occasion? 0  AUDIT-C Score 0   A score of 3 or more in women, and 4 or more in men indicates increased risk for alcohol abuse, EXCEPT if all of the points are from question 1   No results found for any visits on 02/11/23.  Assessment & Plan    Routine Health Maintenance and Physical Exam  Exercise Activities and Dietary recommendations  Goals   None     Immunization History  Administered Date(s) Administered   Influenza-Unspecified 08/17/2020, 08/15/2021   PFIZER(Purple Top)SARS-COV-2 Vaccination 11/08/2019, 12/04/2019, 10/21/2020   Tdap 06/17/2019    Health Maintenance  Topic Date Due   COVID-19 Vaccine (4 - 2023-24 season) 07/05/2022   INFLUENZA VACCINE  06/05/2023   PAP SMEAR-Modifier  01/24/2025   DTaP/Tdap/Td (2 - Td or Tdap) 06/16/2029   Hepatitis C Screening  Completed   HIV Screening  Completed   HPV VACCINES  Aged Out    Discussed health benefits of physical activity, and encouraged her to engage in regular exercise appropriate for her age and condition.  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     I, Shirlee Latch, MD, have reviewed all documentation for this visit. The documentation on 02/11/23 for the exam, diagnosis, procedures, and orders are all accurate and complete.   Shalaine Payson, Marzella Schlein, MD, MPH [facility]

## 2023-02-11 NOTE — Assessment & Plan Note (Signed)
Reviewed last lipid panel Not currently on a statin Recheck FLP and CMP Discussed diet and exercise  

## 2023-02-12 LAB — COMPREHENSIVE METABOLIC PANEL
ALT: 24 IU/L (ref 0–32)
AST: 26 IU/L (ref 0–40)
Albumin/Globulin Ratio: 1.6 (ref 1.2–2.2)
Albumin: 4.7 g/dL (ref 3.9–4.9)
Alkaline Phosphatase: 49 IU/L (ref 44–121)
BUN/Creatinine Ratio: 18 (ref 9–23)
BUN: 13 mg/dL (ref 6–20)
Bilirubin Total: 0.4 mg/dL (ref 0.0–1.2)
CO2: 23 mmol/L (ref 20–29)
Calcium: 8.5 mg/dL — ABNORMAL LOW (ref 8.7–10.2)
Chloride: 99 mmol/L (ref 96–106)
Creatinine, Ser: 0.71 mg/dL (ref 0.57–1.00)
Globulin, Total: 3 g/dL (ref 1.5–4.5)
Glucose: 94 mg/dL (ref 70–99)
Potassium: 4.2 mmol/L (ref 3.5–5.2)
Sodium: 139 mmol/L (ref 134–144)
Total Protein: 7.7 g/dL (ref 6.0–8.5)
eGFR: 114 mL/min/{1.73_m2} (ref >59)

## 2023-02-12 LAB — LIPID PANEL
Chol/HDL Ratio: 3 ratio (ref 0.0–4.4)
Cholesterol, Total: 156 mg/dL (ref 100–199)
HDL: 52 mg/dL (ref >39)
LDL Chol Calc (NIH): 89 mg/dL (ref 0–99)
Triglycerides: 80 mg/dL (ref 0–149)
VLDL Cholesterol Cal: 15 mg/dL (ref 5–40)

## 2023-02-12 LAB — VITAMIN D 25 HYDROXY (VIT D DEFICIENCY, FRACTURES): Vit D, 25-Hydroxy: 79.1 ng/mL (ref 30.0–100.0)

## 2023-02-13 LAB — CYTOLOGY - PAP
Chlamydia: NEGATIVE
Comment: NEGATIVE
Comment: NEGATIVE
Comment: NORMAL
Diagnosis: NEGATIVE
High risk HPV: NEGATIVE
Neisseria Gonorrhea: NEGATIVE

## 2023-02-14 ENCOUNTER — Encounter (INDEPENDENT_AMBULATORY_CARE_PROVIDER_SITE_OTHER): Payer: BC Managed Care – PPO | Admitting: Family Medicine

## 2023-02-14 DIAGNOSIS — B009 Herpesviral infection, unspecified: Secondary | ICD-10-CM

## 2023-02-17 DIAGNOSIS — B009 Herpesviral infection, unspecified: Secondary | ICD-10-CM | POA: Insufficient documentation

## 2023-02-17 MED ORDER — PENCICLOVIR 1 % EX CREA
1.0000 | TOPICAL_CREAM | CUTANEOUS | 3 refills | Status: DC
Start: 2023-02-17 — End: 2024-02-12

## 2023-02-17 NOTE — Telephone Encounter (Signed)

## 2023-03-20 NOTE — Progress Notes (Signed)
I,Sulibeya S Dimas,acting as a Neurosurgeon for Shirlee Latch, MD.,have documented all relevant documentation on the behalf of Shirlee Latch, MD,as directed by  Shirlee Latch, MD while in the presence of Shirlee Latch, MD.     Established patient visit   [name]   DOB: Jun 15, 1986   37 y.o. Female  MRN: [id number] Visit Date: 03/21/2023  Today's healthcare provider: Shirlee Latch, MD   Chief Complaint  Patient presents with   vaginal cyst   Subjective    Vaginal Pain The patient's primary symptoms include genital lesions. This is a new problem. The current episode started in the past 7 days. The problem has been gradually improving. The pain is mild. The problem affects the left side. She is not pregnant. Pertinent negatives include no chills, dysuria or fever. The symptoms are aggravated by tactile pressure. She has tried warm baths for the symptoms. The treatment provided no relief. She uses nothing for contraception.   Present x5 days Popped on its own since making appt Tender to touch No urinary symptoms   Medications: Outpatient Medications Prior to Visit  Medication Sig   calcitRIOL (ROCALTROL) 0.25 MCG capsule Take by mouth.   cholecalciferol (VITAMIN D3) 25 MCG (1000 UNIT) tablet Take 2,000 Units by mouth daily.   levothyroxine (SYNTHROID) 125 MCG tablet Take 1 tablet by mouth daily.   penciclovir (DENAVIR) 1 % cream Apply 1 Application topically every 2 (two) hours.   phentermine (ADIPEX-P) 37.5 MG tablet Take 37.5 mg by mouth daily.   SUMAtriptan (IMITREX) 100 MG tablet Take 1 tab PO as needed at first sign of migraine. May repeat in 2 hours if headache persists or recurs.   No facility-administered medications prior to visit.    Review of Systems  Constitutional:  Negative for chills and fever.  Genitourinary:  Positive for vaginal pain. Negative for dysuria.       Objective    BP 130/87 (BP Location: Left Arm, Patient Position:  Sitting, Cuff Size: Large)   Pulse 88   Temp 98 F (36.7 C) (Temporal)   Resp 16   Wt 140 lb 4.8 oz (63.6 kg)   BMI 28.34 kg/m  BP Readings from Last 3 Encounters:  03/21/23 130/87  02/11/23 109/76  01/24/22 121/86   Wt Readings from Last 3 Encounters:  03/21/23 140 lb 4.8 oz (63.6 kg)  02/11/23 148 lb 1.6 oz (67.2 kg)  01/24/22 184 lb 6.4 oz (83.6 kg)      Physical Exam Vitals reviewed.  Constitutional:      General: She is not in acute distress.    Appearance: She is well-developed.  HENT:     Head: Normocephalic and atraumatic.  Eyes:     General: No scleral icterus.    Conjunctiva/sclera: Conjunctivae normal.  Cardiovascular:     Rate and Rhythm: Normal rate and regular rhythm.  Pulmonary:     Effort: Pulmonary effort is normal. No respiratory distress.  Skin:    General: Skin is warm and dry.     Comments: Draining abscess with surrounding induration of L vulva near groin crease Surrounding erythema  Neurological:     Mental Status: She is alert and oriented to person, place, and time.  Psychiatric:        Behavior: Behavior normal.       No results found for any visits on 03/21/23.  Assessment & Plan     1. Abscess - new problem - outer L vulva near groin crease - independently  draining without need for I&D today - still with some induration, will treat with abx - Doxy x7 d - return precautions discussed - given that she has had recurrent abscesses, consider chlorhexidine wash as prevention  Meds ordered this encounter  Medications   doxycycline (VIBRA-TABS) 100 MG tablet    Sig: Take 1 tablet (100 mg total) by mouth 2 (two) times daily for 7 days.    Dispense:  14 tablet    Refill:  0     Return if symptoms worsen or fail to improve.      I, Shirlee Latch, MD, have reviewed all documentation for this visit. The documentation on 03/21/23 for the exam, diagnosis, procedures, and orders are all accurate and complete.   Itzael Liptak,  Marzella Schlein, MD, MPH [facility]

## 2023-03-21 ENCOUNTER — Ambulatory Visit (INDEPENDENT_AMBULATORY_CARE_PROVIDER_SITE_OTHER): Payer: BC Managed Care – PPO | Admitting: Family Medicine

## 2023-03-21 ENCOUNTER — Encounter: Payer: Self-pay | Admitting: Family Medicine

## 2023-03-21 VITALS — BP 130/87 | HR 88 | Temp 98.0°F | Resp 16 | Wt 140.3 lb

## 2023-03-21 DIAGNOSIS — L0291 Cutaneous abscess, unspecified: Secondary | ICD-10-CM | POA: Diagnosis not present

## 2023-03-21 MED ORDER — DOXYCYCLINE HYCLATE 100 MG PO TABS: 100.0000 mg | ORAL_TABLET | Freq: Two times a day (BID) | ORAL | 0 refills | Status: AC

## 2023-05-21 DIAGNOSIS — E890 Postprocedural hypothyroidism: Secondary | ICD-10-CM | POA: Diagnosis not present

## 2023-05-21 DIAGNOSIS — C73 Malignant neoplasm of thyroid gland: Secondary | ICD-10-CM | POA: Diagnosis not present

## 2023-05-21 DIAGNOSIS — E892 Postprocedural hypoparathyroidism: Secondary | ICD-10-CM | POA: Diagnosis not present

## 2023-05-29 ENCOUNTER — Ambulatory Visit (INDEPENDENT_AMBULATORY_CARE_PROVIDER_SITE_OTHER): Payer: BC Managed Care – PPO | Admitting: Dermatology

## 2023-05-29 VITALS — BP 136/86 | HR 79

## 2023-05-29 DIAGNOSIS — D492 Neoplasm of unspecified behavior of bone, soft tissue, and skin: Secondary | ICD-10-CM

## 2023-05-29 DIAGNOSIS — L918 Other hypertrophic disorders of the skin: Secondary | ICD-10-CM | POA: Diagnosis not present

## 2023-05-29 DIAGNOSIS — D229 Melanocytic nevi, unspecified: Secondary | ICD-10-CM

## 2023-05-29 DIAGNOSIS — L821 Other seborrheic keratosis: Secondary | ICD-10-CM

## 2023-05-29 NOTE — Patient Instructions (Addendum)
Wound Care Instructions  Cleanse wound gently with soap and water once a day then pat dry with clean gauze. Apply a thin coat of Petrolatum (petroleum jelly, "Vaseline") over the wound (unless you have an allergy to this). We recommend that you use a new, sterile tube of Vaseline. Do not pick or remove scabs. Do not remove the yellow or white "healing tissue" from the base of the wound.  Cover the wound with fresh, clean, nonstick gauze and secure with paper tape. You may use Band-Aids in place of gauze and tape if the wound is small enough, but would recommend trimming much of the tape off as there is often too much. Sometimes Band-Aids can irritate the skin.  You should call the office for your biopsy report after 1 week if you have not already been contacted.  If you experience any problems, such as abnormal amounts of bleeding, swelling, significant bruising, significant pain, or evidence of infection, please call the office immediately.  FOR ADULT SURGERY PATIENTS: If you need something for pain relief you may take 1 extra strength Tylenol (acetaminophen) AND 2 Ibuprofen (200mg each) together every 4 hours as needed for pain. (do not take these if you are allergic to them or if you have a reason you should not take them.) Typically, you may only need pain medication for 1 to 3 days.     Due to recent changes in healthcare laws, you may see results of your pathology and/or laboratory studies on MyChart before the doctors have had a chance to review them. We understand that in some cases there may be results that are confusing or concerning to you. Please understand that not all results are received at the same time and often the doctors may need to interpret multiple results in order to provide you with the best plan of care or course of treatment. Therefore, we ask that you please give us 2 business days to thoroughly review all your results before contacting the office for clarification. Should  we see a critical lab result, you will be contacted sooner.   If You Need Anything After Your Visit  If you have any questions or concerns for your doctor, please call our main line at [phone] and press option 4 to reach your doctor's medical assistant. If no one answers, please leave a voicemail as directed and we will return your call as soon as possible. Messages left after 4 pm will be answered the following business day.   You may also send us a message via MyChart. We typically respond to MyChart messages within 1-2 business days.  For prescription refills, please ask your pharmacy to contact our office. Our fax number is [phone].  If you have an urgent issue when the clinic is closed that cannot wait until the next business day, you can page your doctor at the number below.    Please note that while we do our best to be available for urgent issues outside of office hours, we are not available 24/7.   If you have an urgent issue and are unable to reach us, you may choose to seek medical care at your doctor's office, retail clinic, urgent care center, or emergency room.  If you have a medical emergency, please immediately call 911 or go to the emergency department.  Pager Numbers  - Dr. Kowalski: [phone]  - Dr. Moye: [phone]  - Dr. Stewart: [phone]  In the event of inclement weather, please call our main line at   [phone] for an update on the status of any delays or closures.  Dermatology Medication Tips: Please keep the boxes that topical medications come in in order to help keep track of the instructions about where and how to use these. Pharmacies typically print the medication instructions only on the boxes and not directly on the medication tubes.   If your medication is too expensive, please contact our office at [phone] option 4 or send us a message through MyChart.   We are unable to tell what your co-pay for medications will be in  advance as this is different depending on your insurance coverage. However, we may be able to find a substitute medication at lower cost or fill out paperwork to get insurance to cover a needed medication.   If a prior authorization is required to get your medication covered by your insurance company, please allow us 1-2 business days to complete this process.  Drug prices often vary depending on where the prescription is filled and some pharmacies may offer cheaper prices.  The website www.goodrx.com contains coupons for medications through different pharmacies. The prices here do not account for what the cost may be with help from insurance (it may be cheaper with your insurance), but the website can give you the price if you did not use any insurance.  - You can print the associated coupon and take it with your prescription to the pharmacy.  - You may also stop by our office during regular business hours and pick up a GoodRx coupon card.  - If you need your prescription sent electronically to a different pharmacy, notify our office through [facility] MyChart or by phone at [phone] option 4.     Si Usted Necesita Algo Despus de Su Visita  Tambin puede enviarnos un mensaje a travs de MyChart. Por lo general respondemos a los mensajes de MyChart en el transcurso de 1 a 2 das hbiles.  Para renovar recetas, por favor pida a su farmacia que se ponga en contacto con nuestra oficina. Nuestro nmero de fax es el [phone].  Si tiene un asunto urgente cuando la clnica est cerrada y que no puede esperar hasta el siguiente da hbil, puede llamar/localizar a su doctor(a) al nmero que aparece a continuacin.   Por favor, tenga en cuenta que aunque hacemos todo lo posible para estar disponibles para asuntos urgentes fuera del horario de oficina, no estamos disponibles las 24 horas del da, los 7 das de la semana.   Si tiene un problema urgente y no puede comunicarse con nosotros, puede  optar por buscar atencin mdica  en el consultorio de su doctor(a), en una clnica privada, en un centro de atencin urgente o en una sala de emergencias.  Si tiene una emergencia mdica, por favor llame inmediatamente al 911 o vaya a la sala de emergencias.  Nmeros de bper  - Dr. Kowalski: [phone]  - Dra. Moye: [phone]  - Dra. Stewart: [phone]  En caso de inclemencias del tiempo, por favor llame a nuestra lnea principal al [phone] para una actualizacin sobre el estado de cualquier retraso o cierre.  Consejos para la medicacin en dermatologa: Por favor, guarde las cajas en las que vienen los medicamentos de uso tpico para ayudarle a seguir las instrucciones sobre dnde y cmo usarlos. Las farmacias generalmente imprimen las instrucciones del medicamento slo en las cajas y no directamente en los tubos del medicamento.   Si su medicamento es muy caro, por favor, pngase en contacto con   nuestra oficina llamando al [phone] y presione la opcin 4 o envenos un mensaje a travs de MyChart.   No podemos decirle cul ser su copago por los medicamentos por adelantado ya que esto es diferente dependiendo de la cobertura de su seguro. Sin embargo, es posible que podamos encontrar un medicamento sustituto a menor costo o llenar un formulario para que el seguro cubra el medicamento que se considera necesario.   Si se requiere una autorizacin previa para que su compaa de seguros cubra su medicamento, por favor permtanos de 1 a 2 das hbiles para completar este proceso.  Los precios de los medicamentos varan con frecuencia dependiendo del lugar de dnde se surte la receta y alguna farmacias pueden ofrecer precios ms baratos.  El sitio web www.goodrx.com tiene cupones para medicamentos de diferentes farmacias. Los precios aqu no tienen en cuenta lo que podra costar con la ayuda del seguro (puede ser ms barato con su seguro), pero el sitio web puede darle el  precio si no utiliz ningn seguro.  - Puede imprimir el cupn correspondiente y llevarlo con su receta a la farmacia.  - Tambin puede pasar por nuestra oficina durante el horario de atencin regular y recoger una tarjeta de cupones de GoodRx.  - Si necesita que su receta se enve electrnicamente a una farmacia diferente, informe a nuestra oficina a travs de MyChart de [place] o por telfono llamando al [phone] y presione la opcin 4.  

## 2023-05-29 NOTE — Progress Notes (Signed)
New Patient Visit   Subjective  Brittany Walter is a 37 y.o. female who presents for the following: The patient has spots, moles and lesions to be evaluated, some may be new or changing and the patient may have concern these could be cancer.   The following portions of the chart were reviewed this encounter and updated as appropriate: medications, allergies, medical history  Review of Systems:  No other skin or systemic complaints except as noted in HPI or Assessment and Plan.  Objective  Well appearing patient in no apparent distress; mood and affect are within normal limits. A focused examination was performed of the following areas:neck  Relevant exam findings are noted in the Assessment and Plan.  left proximal medial anterior thigh 0.6 cm brown papule        Assessment & Plan    Neoplasm of skin left proximal medial anterior thigh  Epidermal / dermal shaving  Lesion diameter (cm):  0.6 Informed consent: discussed and consent obtained   Timeout: patient name, date of birth, surgical site, and procedure verified   Procedure prep:  Patient was prepped and draped in usual sterile fashion Prep type:  Isopropyl alcohol Anesthesia: the lesion was anesthetized in a standard fashion   Anesthetic:  1% lidocaine w/ epinephrine 1-100,000 buffered w/ 8.4% NaHCO3 Hemostasis achieved with: pressure, aluminum chloride and electrodesiccation   Outcome: patient tolerated procedure well   Post-procedure details: sterile dressing applied and wound care instructions given   Dressing type: bandage and petrolatum    Specimen 1 - Surgical pathology Differential Diagnosis: R/O Nevus vs ISK vs Skin tag  Check Margins: No  Skin tags, multiple acquired  ACROCHORDONS (Skin Tags) - Removal desired by patient (total 9) - Fleshy, skin-colored pedunculated papules - Benign appearing.  - Patient desires removal. Reviewed that this is not covered by insurance and they will be charged a cosmetic  fee for removal. Patient signed non-covered consent.  - Prior to procedure, discussed risks of blister formation, small wound, skin dyspigmentation, or rare scar following cryotherapy.  Destruction Procedure Note Destruction method: cryotherapy  x 2 Informed consent: discussed and consent obtained   Lesion destroyed using liquid nitrogen: Yes   Outcome: patient tolerated procedure well with no complications   Post-procedure details: wound care instructions given   Locations: neck  # of Lesions Treated: 2  - Prior to the procedure, reviewed the expected small wound. Also reviewed the risk of leaving a small scar and the small risk of infection.  PROCEDURE - The areas were prepped with isopropyl alcohol. A small amount of lidocaine 1% with epinephrine was injected at the base of each lesion to achieve good local anesthesia. The skin tags were removed using a snip technique. Aluminum chloride was used for hemostasis. Petrolatum and a bandage were applied. The procedure was tolerated well. - Wound care was reviewed with the patient. They were advised to call with any concerns. Total number of treated acrochordons x 7   MELANOCYTIC NEVI Exam: Tan-brown and/or pink-flesh-colored symmetric macules and papules  Treatment Plan: Benign appearing on exam today. Recommend observation. Call clinic for new or changing moles. Recommend daily use of broad spectrum spf 30+ sunscreen to sun-exposed areas.   Return if symptoms worsen or fail to improve.  IAngelique Holm, CMA, am acting as scribe for Armida Sans, MD .   Documentation: I have reviewed the above documentation for accuracy and completeness, and I agree with the above.  Armida Sans, MD

## 2023-06-02 ENCOUNTER — Encounter: Payer: Self-pay | Admitting: Dermatology

## 2023-06-10 ENCOUNTER — Telehealth: Payer: Self-pay

## 2023-06-10 NOTE — Telephone Encounter (Addendum)
Called patient and discussed bx results. Denied further questions and denies any questions.  ----- Message from Armida Sans sent at 06/05/2023  5:59 PM EDT ----- Diagnosis Skin , left proximal medial anterior thigh FILIFORM AND PIGMENTED SEBORRHEIC KERATOSIS  Benign keratosis No further treatment needed

## 2023-06-13 DIAGNOSIS — F329 Major depressive disorder, single episode, unspecified: Secondary | ICD-10-CM | POA: Diagnosis not present

## 2023-06-16 DIAGNOSIS — F329 Major depressive disorder, single episode, unspecified: Secondary | ICD-10-CM | POA: Diagnosis not present

## 2023-06-26 DIAGNOSIS — F329 Major depressive disorder, single episode, unspecified: Secondary | ICD-10-CM | POA: Diagnosis not present

## 2023-06-30 DIAGNOSIS — F4323 Adjustment disorder with mixed anxiety and depressed mood: Secondary | ICD-10-CM | POA: Diagnosis not present

## 2023-07-02 DIAGNOSIS — F329 Major depressive disorder, single episode, unspecified: Secondary | ICD-10-CM | POA: Diagnosis not present

## 2023-07-10 DIAGNOSIS — F329 Major depressive disorder, single episode, unspecified: Secondary | ICD-10-CM | POA: Diagnosis not present

## 2023-07-16 DIAGNOSIS — F329 Major depressive disorder, single episode, unspecified: Secondary | ICD-10-CM | POA: Diagnosis not present

## 2023-07-23 DIAGNOSIS — F329 Major depressive disorder, single episode, unspecified: Secondary | ICD-10-CM | POA: Diagnosis not present

## 2023-07-28 DIAGNOSIS — F4323 Adjustment disorder with mixed anxiety and depressed mood: Secondary | ICD-10-CM | POA: Diagnosis not present

## 2023-07-30 DIAGNOSIS — F329 Major depressive disorder, single episode, unspecified: Secondary | ICD-10-CM | POA: Diagnosis not present

## 2023-08-04 DIAGNOSIS — F4323 Adjustment disorder with mixed anxiety and depressed mood: Secondary | ICD-10-CM | POA: Diagnosis not present

## 2023-08-13 DIAGNOSIS — F4323 Adjustment disorder with mixed anxiety and depressed mood: Secondary | ICD-10-CM | POA: Diagnosis not present

## 2023-08-18 DIAGNOSIS — F4323 Adjustment disorder with mixed anxiety and depressed mood: Secondary | ICD-10-CM | POA: Diagnosis not present

## 2023-08-20 DIAGNOSIS — F4323 Adjustment disorder with mixed anxiety and depressed mood: Secondary | ICD-10-CM | POA: Diagnosis not present

## 2023-08-27 DIAGNOSIS — F4323 Adjustment disorder with mixed anxiety and depressed mood: Secondary | ICD-10-CM | POA: Diagnosis not present

## 2023-09-03 DIAGNOSIS — F4323 Adjustment disorder with mixed anxiety and depressed mood: Secondary | ICD-10-CM | POA: Diagnosis not present

## 2024-02-12 ENCOUNTER — Ambulatory Visit (INDEPENDENT_AMBULATORY_CARE_PROVIDER_SITE_OTHER): Payer: Self-pay | Admitting: Family Medicine

## 2024-02-12 ENCOUNTER — Encounter: Payer: Self-pay | Admitting: Family Medicine

## 2024-02-12 VITALS — BP 130/98 | HR 83 | Ht 59.0 in | Wt 145.7 lb

## 2024-02-12 DIAGNOSIS — E782 Mixed hyperlipidemia: Secondary | ICD-10-CM | POA: Diagnosis not present

## 2024-02-12 DIAGNOSIS — Z0000 Encounter for general adult medical examination without abnormal findings: Secondary | ICD-10-CM | POA: Diagnosis not present

## 2024-02-12 DIAGNOSIS — E890 Postprocedural hypothyroidism: Secondary | ICD-10-CM | POA: Diagnosis not present

## 2024-02-12 DIAGNOSIS — I471 Supraventricular tachycardia, unspecified: Secondary | ICD-10-CM

## 2024-02-12 DIAGNOSIS — E559 Vitamin D deficiency, unspecified: Secondary | ICD-10-CM | POA: Diagnosis not present

## 2024-02-12 NOTE — Progress Notes (Signed)
 Complete physical exam   [name]   DOB: [birth year]   38 y.o. Female  MRN: [id number] Visit Date: 02/12/2024  Today's healthcare provider: Shirlee Latch, MD   Chief Complaint  Patient presents with   Acute Concern    Pt is concerned about heart rate  On 4/2 she took Loratadine for seasonal allergies, several hours later she noticed the heart rate on her apple watch go from 58 bpm to 112bpm within a minute, and then noticed it happen again hours later that it went from 60bpm to 121 bpm  Pt reports her normal resting is 90 bpm accrording to watch, pt said she could felt like she "could catch my breathe" when these instances occured   HX : Cardiac ablationfor treatment of tachycardia  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 female who presents today for a complete physical exam.   Discussed the use of AI scribe software for clinical note transcription with the patient, who gave verbal consent to proceed.  History of Present Illness   The patient, with a history of paroxysmal supraventricular tachycardia (PSVT) and hypothyroidism, presents for a routine physical examination. The patient reports a recent episode of fluctuating heart rate, with rates dropping to 50-60 bpm and then spiking to 112-121 bpm within a minute. This episode occurred after taking loratadine for allergies, a medication the patient does not usually take. The patient has not experienced a similar episode since and has felt fine overall.  The patient's Synthroid dosage was recently increased due to persistent low energy levels, despite adequate sleep. The patient is due for follow-up blood work in May to assess the effectiveness of the increased dosage. The patient's blood pressure was slightly elevated at the start of the appointment, but this is a common occurrence and usually normalizes after the patient settles.       Last  depression screening scores    02/12/2024   10:27 AM 03/21/2023    8:40 AM 02/11/2023   10:15 AM  PHQ 2/9 Scores  PHQ - 2 Score 0 0 0  PHQ- 9 Score 2 1 1    Last fall risk screening    02/12/2024   10:27 AM  Fall Risk   Falls in the past year? 0  Number falls in past yr: 0  Injury with Fall? 0        Medications: Outpatient Medications Prior to Visit  Medication Sig   calcitRIOL (ROCALTROL) 0.25 MCG capsule Take by mouth.   cholecalciferol (VITAMIN D3) 25 MCG (1000 UNIT) tablet Take 2,000 Units by mouth daily.   phentermine (ADIPEX-P) 37.5 MG tablet Take 37.5 mg by mouth daily.   levothyroxine (SYNTHROID) 125 MCG tablet Take 1 tablet by mouth daily.   [DISCONTINUED] penciclovir (DENAVIR) 1 % cream Apply 1 Application topically every 2 (two) hours. (Patient not taking: Reported on 02/12/2024)   [DISCONTINUED] SUMAtriptan (IMITREX) 100 MG tablet Take 1 tab PO as needed at first sign of migraine. May repeat in 2 hours if headache persists or recurs. (Patient not taking: Reported on 02/12/2024)   No facility-administered medications prior to visit.    Review of Systems    Objective    BP (!) 130/98 (BP Location: Left Arm, Patient Position: Sitting, Cuff Size: Normal)   Pulse 83   Ht 4\' 11"  (1.499 m)   Wt 145 lb 11.2 oz (66.1  kg)   SpO2 100%   BMI 29.43 kg/m    Physical Exam Vitals reviewed.  Constitutional:      General: She is not in acute distress.    Appearance: Normal appearance. She is well-developed. She is not diaphoretic.  HENT:     Head: Normocephalic and atraumatic.     Right Ear: Tympanic membrane, ear canal and external ear normal.     Left Ear: Tympanic membrane, ear canal and external ear normal.     Nose: Nose normal.     Mouth/Throat:     Mouth: Mucous membranes are moist.     Pharynx: Oropharynx is clear. No oropharyngeal exudate.  Eyes:     General: No scleral icterus.    Conjunctiva/sclera: Conjunctivae normal.     Pupils: Pupils are equal,  round, and reactive to light.  Neck:     Thyroid: No thyromegaly.  Cardiovascular:     Rate and Rhythm: Normal rate and regular rhythm.     Heart sounds: Normal heart sounds. No murmur heard. Pulmonary:     Effort: Pulmonary effort is normal. No respiratory distress.     Breath sounds: Normal breath sounds. No wheezing or rales.  Abdominal:     General: There is no distension.     Palpations: Abdomen is soft.     Tenderness: There is no abdominal tenderness.  Musculoskeletal:        General: No deformity.     Cervical back: Neck supple.     Right lower leg: No edema.     Left lower leg: No edema.  Lymphadenopathy:     Cervical: No cervical adenopathy.  Skin:    General: Skin is warm and dry.     Findings: No rash.  Neurological:     Mental Status: She is alert and oriented to person, place, and time. Mental status is at baseline.     Gait: Gait normal.  Psychiatric:        Mood and Affect: Mood normal.        Behavior: Behavior normal.        Thought Content: Thought content normal.      No results found for any visits on 02/12/24.  Assessment & Plan    Routine Health Maintenance and Physical Exam  Exercise Activities and Dietary recommendations  Goals   None     Immunization History  Administered Date(s) Administered   Influenza-Unspecified 08/17/2020, 08/15/2021   PFIZER(Purple Top)SARS-COV-2 Vaccination 11/08/2019, 12/04/2019, 10/21/2020   Tdap 06/17/2019    Health Maintenance  Topic Date Due   COVID-19 Vaccine (4 - 2024-25 season) 07/06/2023   INFLUENZA VACCINE  06/04/2024   Cervical Cancer Screening (HPV/Pap Cotest)  02/11/2028   DTaP/Tdap/Td (2 - Td or Tdap) 06/16/2029   Hepatitis C Screening  Completed   HIV Screening  Completed   HPV VACCINES  Aged Out   Meningococcal B Vaccine  Aged Out    Discussed health benefits of physical activity, and encouraged her to engage in regular exercise appropriate for her age and condition.  Problem List Items  Addressed This Visit       Cardiovascular and Mediastinum   PSVT (paroxysmal supraventricular tachycardia) (HCC)     Endocrine   Hypothyroidism     Other   Hyperlipidemia, mixed   Relevant Orders   Comprehensive metabolic panel with GFR   Lipid Panel With LDL/HDL Ratio   Avitaminosis D   Other Visit Diagnoses       Encounter for annual  physical exam    -  Primary   Relevant Orders   Comprehensive metabolic panel with GFR   Lipid Panel With LDL/HDL Ratio           Paroxysmal Supraventricular Tachycardia (PSVT) Episodes of fluctuating heart rate with initial bradycardia followed by tachycardia occurred after taking loratadine. Loratadine is unlikely the cause. She underwent ablation 14 years ago. Factors such as lack of sleep, caffeine, and stress could contribute to these episodes. The episodes might be coincidental and unrelated to loratadine. If she prefers to avoid loratadine, an alternative like Zyrtec can be considered. - Consider using an alternative allergy medication like Zyrtec instead of loratadine if needed.  Hypothyroidism She is on Synthroid with a recent dosage increase by her endocrinologist, Dr. Gershon Crane, due to low energy levels. Follow-up blood work is scheduled in May to assess the effectiveness of the dosage adjustment. Overcorrection of thyroid medication could potentially cause palpitations and heart racing. - Monitor for palpitations and heart racing as thyroid medication dosage is adjusted.  Hypertension Blood pressure was elevated at the beginning of the visit, a common occurrence for her. Plan to recheck blood pressure at the end of the visit to see if it normalizes after settling. - Recheck blood pressure at the end of the visit.  General Health Maintenance She is up to date on her Pap smear, which was done last year and is not due until 2029. Her tetanus shot is not due until 2030. Breast cancer screening with mammograms will start at age 48. She  is also up to date with her flu shots. - Perform cholesterol, kidney, and liver function tests. - Schedule next year's physical exam.  Follow-up She is scheduled for follow-up blood work with her endocrinologist in May to assess the increased dosage of Synthroid. - Send lab results through MyChart once available. - Schedule follow-up blood work with endocrinologist in May.        Return in about 1 year (around 02/11/2025) for CPE.     Shirlee Latch, MD  [facility] [phone] (phone) [phone] (fax)  [facility]

## 2024-02-13 ENCOUNTER — Encounter: Payer: Self-pay | Admitting: Family Medicine

## 2024-02-13 LAB — COMPREHENSIVE METABOLIC PANEL WITH GFR
ALT: 9 IU/L (ref 0–32)
AST: 17 IU/L (ref 0–40)
Albumin: 4.7 g/dL (ref 3.9–4.9)
Alkaline Phosphatase: 40 IU/L — ABNORMAL LOW (ref 44–121)
BUN/Creatinine Ratio: 14 (ref 9–23)
BUN: 11 mg/dL (ref 6–20)
Bilirubin Total: 0.4 mg/dL (ref 0.0–1.2)
CO2: 22 mmol/L (ref 20–29)
Calcium: 9.1 mg/dL (ref 8.7–10.2)
Chloride: 100 mmol/L (ref 96–106)
Creatinine, Ser: 0.81 mg/dL (ref 0.57–1.00)
Globulin, Total: 2.8 g/dL (ref 1.5–4.5)
Glucose: 83 mg/dL (ref 70–99)
Potassium: 3.9 mmol/L (ref 3.5–5.2)
Sodium: 138 mmol/L (ref 134–144)
Total Protein: 7.5 g/dL (ref 6.0–8.5)
eGFR: 97 mL/min/{1.73_m2} (ref >59)

## 2024-02-13 LAB — LIPID PANEL WITH LDL/HDL RATIO
Cholesterol, Total: 169 mg/dL (ref 100–199)
HDL: 52 mg/dL (ref >39)
LDL Chol Calc (NIH): 96 mg/dL (ref 0–99)
LDL/HDL Ratio: 1.8 ratio (ref 0.0–3.2)
Triglycerides: 117 mg/dL (ref 0–149)
VLDL Cholesterol Cal: 21 mg/dL (ref 5–40)

## 2025-02-14 ENCOUNTER — Encounter: Admitting: Family Medicine
